# Patient Record
Sex: Male | Born: 1945 | Race: White | Hispanic: No | Marital: Married | Smoking: Former smoker
Health system: Southern US, Community
[De-identification: ages and names within clinical notes are randomized; demographics above are authoritative.]

## PROBLEM LIST (undated history)

## (undated) DIAGNOSIS — IMO0002 Reserved for concepts with insufficient information to code with codable children: Secondary | ICD-10-CM

## (undated) DIAGNOSIS — K219 Gastro-esophageal reflux disease without esophagitis: Secondary | ICD-10-CM

## (undated) DIAGNOSIS — I2510 Atherosclerotic heart disease of native coronary artery without angina pectoris: Secondary | ICD-10-CM

## (undated) DIAGNOSIS — M1990 Unspecified osteoarthritis, unspecified site: Secondary | ICD-10-CM

## (undated) DIAGNOSIS — R112 Nausea with vomiting, unspecified: Secondary | ICD-10-CM

## (undated) DIAGNOSIS — Z9889 Other specified postprocedural states: Secondary | ICD-10-CM

## (undated) DIAGNOSIS — K649 Unspecified hemorrhoids: Secondary | ICD-10-CM

## (undated) DIAGNOSIS — I10 Essential (primary) hypertension: Secondary | ICD-10-CM

## (undated) HISTORY — PX: APPENDECTOMY: SHX54

## (undated) HISTORY — PX: TONSILLECTOMY: SUR1361

---

## 1985-12-03 HISTORY — PX: BACK SURGERY: SHX140

## 1987-12-04 HISTORY — PX: BACK SURGERY: SHX140

## 1991-12-04 HISTORY — PX: BACK SURGERY: SHX140

## 2008-12-03 HISTORY — PX: CARDIAC CATHETERIZATION: SHX172

## 2009-04-18 ENCOUNTER — Encounter: Payer: Self-pay | Admitting: Emergency Medicine

## 2009-04-18 ENCOUNTER — Inpatient Hospital Stay (HOSPITAL_COMMUNITY): Admission: AD | Admit: 2009-04-18 | Discharge: 2009-04-21 | Payer: Self-pay | Admitting: Cardiology

## 2009-04-18 ENCOUNTER — Ambulatory Visit: Payer: Self-pay | Admitting: Cardiology

## 2009-04-20 ENCOUNTER — Encounter: Payer: Self-pay | Admitting: Cardiology

## 2009-05-11 ENCOUNTER — Encounter: Payer: Self-pay | Admitting: Internal Medicine

## 2009-05-11 ENCOUNTER — Ambulatory Visit: Payer: Self-pay

## 2009-05-18 DIAGNOSIS — I2510 Atherosclerotic heart disease of native coronary artery without angina pectoris: Secondary | ICD-10-CM | POA: Insufficient documentation

## 2009-05-18 DIAGNOSIS — M79609 Pain in unspecified limb: Secondary | ICD-10-CM

## 2009-05-18 DIAGNOSIS — I10 Essential (primary) hypertension: Secondary | ICD-10-CM | POA: Insufficient documentation

## 2009-05-18 DIAGNOSIS — M549 Dorsalgia, unspecified: Secondary | ICD-10-CM | POA: Insufficient documentation

## 2009-05-18 DIAGNOSIS — I498 Other specified cardiac arrhythmias: Secondary | ICD-10-CM

## 2009-05-18 DIAGNOSIS — J301 Allergic rhinitis due to pollen: Secondary | ICD-10-CM | POA: Insufficient documentation

## 2009-05-18 DIAGNOSIS — R002 Palpitations: Secondary | ICD-10-CM | POA: Insufficient documentation

## 2009-05-19 ENCOUNTER — Ambulatory Visit: Payer: Self-pay | Admitting: Internal Medicine

## 2011-03-13 LAB — BASIC METABOLIC PANEL
BUN: 11 mg/dL (ref 6–23)
CO2: 27 mEq/L (ref 19–32)
CO2: 28 mEq/L (ref 19–32)
Calcium: 8.8 mg/dL (ref 8.4–10.5)
Chloride: 108 mEq/L (ref 96–112)
Chloride: 112 mEq/L (ref 96–112)
Creatinine, Ser: 0.87 mg/dL (ref 0.4–1.5)
GFR calc Af Amer: >60 mL/min (ref >60)
GFR calc non Af Amer: >60 mL/min (ref >60)
Glucose, Bld: 108 mg/dL — ABNORMAL HIGH (ref 70–99)
Glucose, Bld: 176 mg/dL — ABNORMAL HIGH (ref 70–99)
Glucose, Bld: 95 mg/dL (ref 70–99)
Potassium: 4.3 mEq/L (ref 3.5–5.1)
Sodium: 142 mEq/L (ref 135–145)
Sodium: 143 mEq/L (ref 135–145)

## 2011-03-13 LAB — POCT I-STAT 3, ART BLOOD GAS (G3+)
Acid-base deficit: 1 mmol/L (ref 0.0–2.0)
Bicarbonate: 22.2 mEq/L (ref 20.0–24.0)
pH, Arterial: 7.436 (ref 7.350–7.450)

## 2011-03-13 LAB — CBC
HCT: 38.8 % — ABNORMAL LOW (ref 39.0–52.0)
HCT: 41.6 % (ref 39.0–52.0)
HCT: 43 % (ref 39.0–52.0)
HCT: 43.1 % (ref 39.0–52.0)
Hemoglobin: 13.5 g/dL (ref 13.0–17.0)
Hemoglobin: 14.7 g/dL (ref 13.0–17.0)
Hemoglobin: 14.9 g/dL (ref 13.0–17.0)
MCHC: 34.8 g/dL (ref 30.0–36.0)
MCV: 94.9 fL (ref 78.0–100.0)
Platelets: 234 10*3/uL (ref 150–400)
Platelets: 249 10*3/uL (ref 150–400)
RDW: 13.8 % (ref 11.5–15.5)
RDW: 13.8 % (ref 11.5–15.5)
RDW: 13.9 % (ref 11.5–15.5)
WBC: 7.3 10*3/uL (ref 4.0–10.5)
WBC: 7.8 10*3/uL (ref 4.0–10.5)

## 2011-03-13 LAB — DIFFERENTIAL
Basophils Absolute: 0 10*3/uL (ref 0.0–0.1)
Basophils Absolute: 0 10*3/uL (ref 0.0–0.1)
Basophils Relative: 0 % (ref 0–1)
Eosinophils Absolute: 0.1 10*3/uL (ref 0.0–0.7)
Eosinophils Relative: 1 % (ref 0–5)
Lymphocytes Relative: 28 % (ref 12–46)
Lymphs Abs: 2.3 10*3/uL (ref 0.7–4.0)
Monocytes Absolute: 0.6 10*3/uL (ref 0.1–1.0)
Monocytes Absolute: 0.6 10*3/uL (ref 0.1–1.0)
Neutro Abs: 4.9 10*3/uL (ref 1.7–7.7)
Neutrophils Relative %: 63 % (ref 43–77)

## 2011-03-13 LAB — BRAIN NATRIURETIC PEPTIDE: Pro B Natriuretic peptide (BNP): <30 pg/mL (ref 0.0–100.0)

## 2011-03-13 LAB — MAGNESIUM: Magnesium: 2.5 mg/dL (ref 1.5–2.5)

## 2011-03-13 LAB — COMPREHENSIVE METABOLIC PANEL
Alkaline Phosphatase: 67 U/L (ref 39–117)
BUN: 17 mg/dL (ref 6–23)
Chloride: 107 mEq/L (ref 96–112)
Glucose, Bld: 103 mg/dL — ABNORMAL HIGH (ref 70–99)
Potassium: 4.1 mEq/L (ref 3.5–5.1)
Total Bilirubin: 0.7 mg/dL (ref 0.3–1.2)

## 2011-03-13 LAB — LIPID PANEL
HDL: 44 mg/dL (ref >39)
Triglycerides: 97 mg/dL (ref <150)
VLDL: 19 mg/dL (ref 0–40)

## 2011-03-13 LAB — POCT CARDIAC MARKERS: Myoglobin, poc: 119 ng/mL (ref 12–200)

## 2011-03-13 LAB — D-DIMER, QUANTITATIVE: D-Dimer, Quant: 0.24 ug/mL-FEU (ref 0.00–0.48)

## 2011-03-13 LAB — CARDIAC PANEL(CRET KIN+CKTOT+MB+TROPI)
CK, MB: 10.4 ng/mL — ABNORMAL HIGH (ref 0.3–4.0)
CK, MB: 6.1 ng/mL — ABNORMAL HIGH (ref 0.3–4.0)
CK, MB: 6.5 ng/mL — ABNORMAL HIGH (ref 0.3–4.0)
Relative Index: 3.1 — ABNORMAL HIGH (ref 0.0–2.5)
Relative Index: 3.8 — ABNORMAL HIGH (ref 0.0–2.5)
Total CK: 207 U/L (ref 7–232)
Total CK: 242 U/L — ABNORMAL HIGH (ref 7–232)
Troponin I: 0.02 ng/mL (ref 0.00–0.06)

## 2011-03-13 LAB — PROTIME-INR
INR: 0.9 (ref 0.00–1.49)
INR: 1 (ref 0.00–1.49)
Prothrombin Time: 13 seconds (ref 11.6–15.2)

## 2011-03-13 LAB — APTT: aPTT: 26 seconds (ref 24–37)

## 2011-03-13 LAB — POCT I-STAT, CHEM 8
BUN: 20 mg/dL (ref 6–23)
Calcium, Ion: 1.18 mmol/L (ref 1.12–1.32)
Glucose, Bld: 94 mg/dL (ref 70–99)
Sodium: 139 mEq/L (ref 135–145)
TCO2: 25 mmol/L (ref 0–100)

## 2011-03-13 LAB — TSH: TSH: 0.689 u[IU]/mL (ref 0.350–4.500)

## 2011-03-13 LAB — HEPARIN LEVEL (UNFRACTIONATED): Heparin Unfractionated: 0.56 IU/mL (ref 0.30–0.70)
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
## 2011-04-17 NOTE — Cardiovascular Report (Signed)
Joel Mcneil, Joel Mcneil NO.:  [id number]   MEDICAL RECORD NO.:  [id number]           PATIENT TYPE:   LOCATION:                                 FACILITY:   PHYSICIAN:  Arturo Morton. Riley Kill, MD, FACCDATE OF BIRTH:  10-10-1946   DATE OF PROCEDURE:  04/19/2009  DATE OF DISCHARGE:                            CARDIAC CATHETERIZATION   INDICATIONS:  Joel Mcneil is a 65 year old gentleman whose wife is a  Engineer, civil (consulting).  He developed some heaviness in the chest.  He was brought to  [facility] Emergency Room and subsequently transferred to the [facility] Emergency Room.  Importantly, the patient did have relief with  nitroglycerin.  CPK-MB was positive, but troponins have been normal.  He  was seen in consultation and subsequently referred for diagnostic  cardiac catheterization.  Risks, benefits, and alternatives were  discussed with the patient, and he consented to proceed.   PROCEDURE:  1. Left heart catheterization  2. Selective coronary arteriography.  3. Selective left ventriculography.   DESCRIPTION OF THE PROCEDURE:  The patient was brought to the  catheterization laboratory and prepped and draped in the usual fashion.  Through an anterior puncture, the right femoral artery was easily  entered.  A 5-French sheath was subsequently placed without difficulty.  Following this, left ventricular pressures were recorded.  We did do an  arterial blood gas as well.  Coronary arteriography was then performed  using a left Judkins catheter, and a Williams right catheter.  Following  this, central aortic and left ventricular pressures were measured with  the pigtail, and ventriculography was done in the RAO projection.  There  were no major complications.  He was taken to the holding area in  satisfactory clinical condition.   HEMODYNAMIC DATA:  1. The initial central aortic pressure was 102/65, mean 82.  2. Left ventricular pressure was 102/8.  3. There were no gradient or  pullback across the aortic valve.  4. Arterial blood gas pH 7.42, pO2 78, pCO2 33.   ANGIOGRAPHIC DATA:  1. On plain fluoroscopy, there is mild calcification of the mid left      anterior descending artery.  2. Ventriculography in the RAO projection reveals preserved global      systolic function.  Ejection fraction may be at the low limits of      normal, perhaps 50-55% by estimate.  There does not appear to be      significant mitral regurgitation noted.  3. The left main is free of critical disease.  4. The LAD courses to the apex.  There is one major diagonal branch.      There is calcification that appears just beyond the diagonal branch      with about 30-40% plaquing in this area.  It does not appear to be      high grade.  Likewise, the diagonal branch probably has some ostial      narrowing of about 30%.  No critical stenosis is noted.  5. The circumflex is a very large-caliber vessel.  There is a tiny  first marginal branch with 50% ostial narrowing and second marginal      with perhaps 30-40% proximal narrowing.  The distal circumflex      bifurcates into a large posterolateral segment, and marginal is      free of critical disease.  6. The right coronary artery is a modest-size vessel.  There is      perhaps minimal luminal irregularity but no significant focal      stenosis.   CONCLUSIONS:  1. Preserved and low normal overall ejection fraction.  2. Modest calcification of the mid left anterior descending artery      with mild plaquing.  3. Minor scattered irregularities as noted above.   DISPOSITION:  The patient did have improvement with IV nitroglycerin, he  also has a positive CPK-MB, although the troponin is normal.  We will  continue to monitor him.  Of note, his chest x-ray reveals normal heart  size and contour without interstitial edema.  His LVEDP is normal.  His  D-dimer has been checked and is unremarkable.  A 2-D echo will be  obtained.       Arturo Morton. Riley Kill, MD, [facility]  Electronically Signed     TDS/MEDQ  D:  04/19/2009  T:  04/20/2009  Job:  [id number]   cc:   Arturo Morton. Riley Kill, MD, [facility]  [name], MD  CV Laboratory.

## 2011-04-17 NOTE — Discharge Summary (Signed)
Joel Mcneil, Joel Mcneil NO.:  [id number]   MEDICAL RECORD NO.:  [id number]          PATIENT TYPE:  INP   LOCATION:  3711                         FACILITY:  [facility]   PHYSICIAN:  Doylene Canning. Ladona Ridgel, MD    DATE OF BIRTH:  28-Feb-1946   DATE OF ADMISSION:  04/18/2009  DATE OF DISCHARGE:  04/20/2009                               DISCHARGE SUMMARY   The patient has allergy to PENICILLIN, which causes hives.   Time for this dictation, examination, and explanation to the patient  greater than 45 minutes.   FINAL DIAGNOSES:  1. Discharging day 1, status post left heart catheterization, study      shows nonobstructive coronary artery disease, ejection fraction of      50-55%.  2. The patient admitted with chest pain over a 3- to 4-days period      prior to admission and described as heaviness, it would wax and      wane.      a.     Also, palpitation.  3. Bigeminy on cardiac monitor.  4. Troponin I studies x4 all negative.  5. Electrocardiogram nondiagnostic for acute coronary syndrome.   SECONDARY DIAGNOSES:  1. Chronic back pain.      a.     Four back surgeries with fusion.  2. Seasonal allergies.  3. Hypertension with Norvasc recently advanced from 5 mg to 10 mg.  4. The patient has chronic back pain.   PROCEDURE:  Apr 19, 2009, left heart catheterization study showed that  there was nonobstructive coronary artery disease.  The left anterior  descending had a 40% stenosis after a diagonal.  The left circumflex was  free of significant disease; however, the first obtuse marginal with 50%  ostial stenosis, the second obtuse marginal with 30-40% ostial, the  right coronary artery system was free of significant disease, and the  ejection fraction at low normal 50-55%.  The patient will have a 2-D  echocardiogram in 3-4 weeks to assess ejection fraction and right heart  pressures.   BRIEF HISTORY:  Joel Mcneil is a 65 year old male.  He has a history of  hypertension.   He presents with 4 to 5 days' worth of chest pressure.  He noticed this at rest, it is a heavy sensation across the chest  anteriorly, and it is associated with palpitation.  He rates it as 6/10  in intensity.  It is associated with shortness of breath.  He does not  have diaphoresis or nausea, and there is no radiation.   The patient has been recently working hard in the backyard tilling.  He  notes profound weakness for the last 2-3 weeks.  This has been  progressive.  He usually walks on a treadmill, but has not used a  treadmill due to this weakness for the last 2-3 weeks.   The patient is also noted by Joel Mcneil that he is not even getting around  the house much, sitting in the recliner mostly.  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  She is concerned about this and spoke with him today about it and  he  said he was scared.  He thinks he may be having a heart attack.  The  patient then drove himself with Joel Mcneil to [facility] Emergency Room,  he was found to be relatively hypotensive, blood pressure in the low  100s.  He was given IV fluids and he has not received any beta-blockade  or nitrites at [facility].  He was transferred to [facility]  having been given aspirin 81 mg x4.   The patient notes chest pressure 1-3 times every day, lasting 3-4 hours  at a time.  On transport to [facility], he noted Joel chest pressure  4/10.  He has been given Lopressor 5 mg IV.   The patient has a history of hypertension and now having chest pressure  concerning for acute coronary syndrome.  He has decreased exercise  tolerance lately.  He will be admitted to rule out myocardial infarction  with serial cardiac enzymes.  He will be placed on heparin drip and  continued on beta-blockers.  Joel Norvasc will be helped.  Joel lipid  panel will be also checked.  He will be held n.p.o. for possible  evaluation by stress or catheterization the next morning.   HOSPITAL COURSE:  The patient was admitted on May 17  and transferred  from [facility] with waxing and waning chest heaviness.  Joel  troponin enzymes were negative x4.  Electrocardiogram showed no ST-T  changes.  He arrived on IV heparin.  He was seen in consultation on May  18 by Dr. Ladona Ridgel.  Dr. Ladona Ridgel thought Joel chest pain, which had actually  kicked up again at the time of examination somewhat concerning and  scheduled him for left and right heart catheterizations.  A left heart  catheterization was obtained.  It showed nonobstructive coronary artery  disease as described above with ejection fraction 50-55%.  The patient  on postprocedure day #1 is relating that Joel chest pain has resolved.  He had a long conversation with Dr. Ladona Ridgel regarding activity and what  to expect going forward.  Joel medications have been somewhat changed.   MEDICATIONS:  1. He continues on enteric-coated aspirin 81 mg daily.  2. New medication, metoprolol succinate 25 mg daily.  3. New medication, simvastatin 20 mg daily at bedtime for cholesterol      control.  4. Diclofenac 75 mg twice daily.  5. Oxycodone 10/325 four times a day as needed.  6. Fish oil capsule take as before this admission.  7. Advil as needed.  8. He is asked to stop taking Norvasc.   He follows up at [facility] [address], [place].  1. Echocardiogram on Wednesday, June 9 at 10:30.  2. To see Dr. Ladona Ridgel on Thursday, June 7 at 4 o'clock.   At this point of the dictation, Dr. Riley Kill stepped by, and after having  talk with Dr. Ladona Ridgel, he thought that the patient should stay another  day.  Joel CK-MBs were elevated, but are declining on a regular basis.  He thought that the patient needs further observation.  We will amend  the followup dictation starting May 20.  This is what we know as at the  current time.   I will give the laboratory studies as of this date May 19.  Joel  hemoglobin May 19 was 13.5, hematocrit 38.8, platelets of 218, and white  cells  are 7.1.  Sodium 143, potassium 4.3, chloride 112, carbonate 27,  BUN is  15, creatinine is 1, and glucose is 95.  The BNP this admission  was less than 30.  Total cholesterol 216, triglycerides 97,  HDL cholesterol 44, and LDL cholesterol 153.  Joel TSH this admission was  0.689.  The patient's troponin I studies were as follows:  0.82, then  0.02, then 0.01, then 0.01.  The CK-MBs on May 17 at 10.4, CK-MB May 18  at 9.3, CK-MB on May 18 at 5.7, and CK-MB on May 19 is 6.1.      Maple Mirza, PA      Doylene Canning. Ladona Ridgel, MD  Electronically Signed    GM/MEDQ  D:  04/20/2009  T:  04/20/2009  Job:  [id number]   cc:   [name], MD

## 2013-05-06 ENCOUNTER — Other Ambulatory Visit: Payer: Self-pay | Admitting: Neurosurgery

## 2013-05-06 DIAGNOSIS — M48061 Spinal stenosis, lumbar region without neurogenic claudication: Secondary | ICD-10-CM

## 2013-05-14 ENCOUNTER — Ambulatory Visit
Admission: RE | Admit: 2013-05-14 | Discharge: 2013-05-14 | Disposition: A | Discharge status: Home / Self Care | Payer: Medicare Other | Source: Ambulatory Visit | Attending: Neurosurgery | Admitting: Neurosurgery

## 2013-05-14 DIAGNOSIS — M48061 Spinal stenosis, lumbar region without neurogenic claudication: Secondary | ICD-10-CM

## 2013-05-14 MED ORDER — GADOBENATE DIMEGLUMINE 529 MG/ML IV SOLN
18.0000 mL | Freq: Once | INTRAVENOUS | Status: AC | PRN
  Administered 2013-05-14: 18 mL via INTRAVENOUS

## 2013-06-08 ENCOUNTER — Other Ambulatory Visit: Payer: Self-pay | Admitting: Neurosurgery

## 2013-06-08 ENCOUNTER — Encounter (HOSPITAL_COMMUNITY): Payer: Self-pay | Admitting: Pharmacy Technician

## 2013-06-16 ENCOUNTER — Encounter (HOSPITAL_COMMUNITY)
Admission: RE | Admit: 2013-06-16 | Discharge: 2013-06-16 | Disposition: A | Discharge status: Home / Self Care | Payer: Medicare Other | Source: Ambulatory Visit | Attending: Neurosurgery | Admitting: Neurosurgery

## 2013-06-16 ENCOUNTER — Encounter (HOSPITAL_COMMUNITY): Payer: Self-pay

## 2013-06-16 ENCOUNTER — Other Ambulatory Visit: Payer: Self-pay | Admitting: Neurosurgery

## 2013-06-16 HISTORY — DX: Other specified postprocedural states: Z98.890

## 2013-06-16 HISTORY — DX: Atherosclerotic heart disease of native coronary artery without angina pectoris: I25.10

## 2013-06-16 HISTORY — DX: Unspecified osteoarthritis, unspecified site: M19.90

## 2013-06-16 HISTORY — DX: Essential (primary) hypertension: I10

## 2013-06-16 HISTORY — DX: Nausea with vomiting, unspecified: R11.2

## 2013-06-16 HISTORY — DX: Unspecified hemorrhoids: K64.9

## 2013-06-16 HISTORY — DX: Reserved for concepts with insufficient information to code with codable children: IMO0002

## 2013-06-16 HISTORY — DX: Gastro-esophageal reflux disease without esophagitis: K21.9

## 2013-06-16 LAB — URINALYSIS, ROUTINE W REFLEX MICROSCOPIC
Leukocytes, UA: NEGATIVE
Nitrite: NEGATIVE
Specific Gravity, Urine: 1.029 (ref 1.005–1.030)
pH: 5.5 (ref 5.0–8.0)

## 2013-06-16 LAB — BASIC METABOLIC PANEL
Calcium: 9.4 mg/dL (ref 8.4–10.5)
Chloride: 105 mEq/L (ref 96–112)
Creatinine, Ser: 1.02 mg/dL (ref 0.50–1.35)
GFR calc Af Amer: 85 mL/min — ABNORMAL LOW (ref >90)
Sodium: 142 mEq/L (ref 135–145)

## 2013-06-16 LAB — CBC WITH DIFFERENTIAL/PLATELET
Basophils Absolute: 0 10*3/uL (ref 0.0–0.1)
HCT: 43.6 % (ref 39.0–52.0)
Lymphocytes Relative: 32 % (ref 12–46)
Lymphs Abs: 2.1 10*3/uL (ref 0.7–4.0)
Neutro Abs: 3.8 10*3/uL (ref 1.7–7.7)
Platelets: 189 10*3/uL (ref 150–400)
RBC: 4.58 MIL/uL (ref 4.22–5.81)
RDW: 14.8 % (ref 11.5–15.5)
WBC: 6.5 10*3/uL (ref 4.0–10.5)

## 2013-06-16 LAB — TYPE AND SCREEN: Antibody Screen: NEGATIVE

## 2013-06-16 LAB — APTT: aPTT: 26 seconds (ref 24–37)

## 2013-06-16 LAB — SURGICAL PCR SCREEN: Staphylococcus aureus: POSITIVE — AB

## 2013-06-16 LAB — PROTIME-INR
INR: 0.91 (ref 0.00–1.49)
Prothrombin Time: 12.1 seconds (ref 11.6–15.2)

## 2013-06-16 NOTE — Pre-Procedure Instructions (Signed)
Joel Mcneil  06/16/2013   Your procedure is scheduled on:  Thursday July 17,2014  Report to [facility] at 0530 AM.  Call this number if you have problems the morning of surgery: [phone]   Remember:   Do not eat food or drink liquids after midnight.   Take these medicines the morning of surgery with A SIP OF WATER: Gabapentin, Morphine(MSIR), and Omeprazole   Do not wear jewelry.  Do not wear lotions, powders, or perfumes. You may wear deodorant.   Men may shave face and neck.  Do not bring valuables to the hospital.  [facility] is not responsible  for any belongings or valuables.  Contacts, dentures or bridgework may not be worn into surgery.  Leave suitcase in the car. After surgery it may be brought to your room.  For patients admitted to the hospital, checkout time is 11:00 AM the day of  discharge.   Patients discharged the day of surgery will not be allowed to drive  home.    Special Instructions: Shower using CHG 2 nights before surgery and the night before surgery.  If you shower the day of surgery use CHG.  Use special wash - you have one bottle of CHG for all showers.  You should use approximately 1/3 of the bottle for each shower.   Please read over the following fact sheets that you were given: Pain Booklet, Coughing and Deep Breathing, Blood Transfusion Information, MRSA Information and Surgical Site Infection Prevention

## 2013-06-16 NOTE — Progress Notes (Signed)
Anesthesia Chart Review:  Patient is a 67 year old male scheduled for L2-3, L3-4 PLIF, right L1-2 laminectomy on 06/18/13 by Dr. Phoebe Perch.  History includes post-operative N/V, former smoker, HTN, GERD, arthritis, tonsillectomy, appendectomy, prior back surgery.  He had non-obstructive CAD by 2010 cath.  He has not seen cardiology (Dr. Lewayne Bunting) since 05/2009.  PCP is listed as Dr. [name] [phone]).  He remains on b-blocker therapy.  EKG on 06/16/13 showed SB at 54 bpm.  No CV symptoms were documented at his PAT visit.  Cardiac cath on 04/19/09 (done for chest pain with elevated CK-MB but negative troponin) showed mild calcification of the mid LAD, EF estimated at 50-55%, no significant MR.  Left main is free of critical disease.  The LAD courses to the apex. There is one major diagonal branch. There is calcification that appears just beyond the diagonal branch with about 30-40% plaquing in this area. The diagonal branch has ~ 30% ostial stenosis.  Circumflex is a very large-caliber vessell.  There is a tiny OM1 With 50% ostial narrowing, OM2 with 30-40% proximal narrowing. The distal CX bifurcates into a large PL segment and marginal is free of critical disease.  RCA is moderate size with minimal luminal irregularities.    Echo on 05/11/09 showed: 1. Left ventricle: The cavity size was normal. There was mild focal basal hypertrophy of the septum. Systolic function was normal. The estimated ejection fraction was in the range of 60% to 65%. Wall motion was normal; there were no regional wall motion abnormalities. Doppler parameters are consistent with abnormal left ventricular relaxation (grade 1 diastolic dysfunction). Doppler parameters are consistent with high ventricular filling pressure. 2. Left atrium: The atrium was mildly dilated.  CXR on 06/16/13 showed: Minimal bronchitic changes and hyperaeration. Questionable nodular density versus clothing artifact at right apex; recommend repeat PA chest  radiograph with shirt/clothing removed for better assessment.  (This has already been reviewed by Dr. Phoebe Perch who has ordered a repeat PA CXR on the morning of surgery without upper body clothing.)  Preoperative labs noted.    Patient had non-obstructive CAD by cath ~ 4 years ago.  He is not currently followed by cardiology.  EKG did not show any ischemic changes.  He will be evaluated by his assigned anesthesiologist on the day of surgery.  If he remains asymptomatic from a CV standpoint then I would anticipate that he could proceed as planned.  Anesthesiologist Dr. Chaney Malling agrees with this plan.  Velna Ochs [facility]/Anesthesiology Phone [phone] 06/16/2013 5:06 PM

## 2013-06-17 MED ORDER — VANCOMYCIN HCL IN DEXTROSE 1-5 GM/200ML-% IV SOLN
1000.0000 mg | INTRAVENOUS | Status: AC
  Administered 2013-06-18: 1000 mg via INTRAVENOUS
  Filled 2013-06-17 (×2): qty 200

## 2013-06-18 ENCOUNTER — Inpatient Hospital Stay (HOSPITAL_COMMUNITY): Payer: Medicare Other

## 2013-06-18 ENCOUNTER — Inpatient Hospital Stay (HOSPITAL_COMMUNITY)
Admission: RE | Admit: 2013-06-18 | Discharge: 2013-06-22 | DRG: 458 | Disposition: A | Discharge status: Home Health | Payer: Medicare Other | Source: Ambulatory Visit | Attending: Neurosurgery | Admitting: Neurosurgery

## 2013-06-18 ENCOUNTER — Encounter (HOSPITAL_COMMUNITY): Payer: Self-pay | Admitting: *Deleted

## 2013-06-18 ENCOUNTER — Inpatient Hospital Stay (HOSPITAL_COMMUNITY): Payer: Medicare Other | Admitting: Anesthesiology

## 2013-06-18 ENCOUNTER — Encounter (HOSPITAL_COMMUNITY)
Admission: RE | Disposition: A | Discharge status: Home Health | Payer: Self-pay | Source: Ambulatory Visit | Attending: Neurosurgery

## 2013-06-18 ENCOUNTER — Encounter (HOSPITAL_COMMUNITY): Payer: Self-pay | Admitting: Vascular Surgery

## 2013-06-18 DIAGNOSIS — Z7982 Long term (current) use of aspirin: Secondary | ICD-10-CM

## 2013-06-18 DIAGNOSIS — Z01812 Encounter for preprocedural laboratory examination: Secondary | ICD-10-CM

## 2013-06-18 DIAGNOSIS — M47817 Spondylosis without myelopathy or radiculopathy, lumbosacral region: Secondary | ICD-10-CM | POA: Diagnosis present

## 2013-06-18 DIAGNOSIS — Z79899 Other long term (current) drug therapy: Secondary | ICD-10-CM

## 2013-06-18 DIAGNOSIS — M4120 Other idiopathic scoliosis, site unspecified: Principal | ICD-10-CM | POA: Diagnosis present

## 2013-06-18 SURGERY — POSTERIOR LUMBAR FUSION 2 LEVEL
Anesthesia: General | Site: Back | Wound class: Clean

## 2013-06-18 MED ORDER — ONDANSETRON HCL 4 MG/2ML IJ SOLN: 4.0000 mg | Freq: Once | INTRAMUSCULAR | Status: DC | PRN

## 2013-06-18 MED ORDER — LIDOCAINE HCL (CARDIAC) 20 MG/ML IV SOLN
INTRAVENOUS | Status: DC | PRN
  Administered 2013-06-18: 40 mg via INTRAVENOUS

## 2013-06-18 MED ORDER — SODIUM CHLORIDE 0.9 % IJ SOLN
3.0000 mL | INTRAMUSCULAR | Status: DC | PRN
  Administered 2013-06-19: 3 mL via INTRAVENOUS

## 2013-06-18 MED ORDER — KETOROLAC TROMETHAMINE 30 MG/ML IJ SOLN
30.0000 mg | Freq: Four times a day (QID) | INTRAMUSCULAR | Status: AC
  Administered 2013-06-18 – 2013-06-19 (×3): 30 mg via INTRAVENOUS
  Filled 2013-06-18 (×3): qty 1

## 2013-06-18 MED ORDER — MORPHINE SULFATE 15 MG PO TABS
15.0000 mg | ORAL_TABLET | Freq: Three times a day (TID) | ORAL | Status: DC
  Administered 2013-06-18 – 2013-06-22 (×11): 15 mg via ORAL
  Filled 2013-06-18 (×11): qty 1

## 2013-06-18 MED ORDER — HYDROMORPHONE HCL PF 1 MG/ML IJ SOLN
INTRAMUSCULAR | Status: DC | PRN
  Administered 2013-06-18 (×2): 0.5 mg via INTRAVENOUS

## 2013-06-18 MED ORDER — FENTANYL CITRATE 0.05 MG/ML IJ SOLN
INTRAMUSCULAR | Status: DC | PRN
  Administered 2013-06-18: 50 ug via INTRAVENOUS
  Administered 2013-06-18: 100 ug via INTRAVENOUS
  Administered 2013-06-18 (×2): 50 ug via INTRAVENOUS
  Administered 2013-06-18: 100 ug via INTRAVENOUS

## 2013-06-18 MED ORDER — MIDAZOLAM HCL 5 MG/5ML IJ SOLN
INTRAMUSCULAR | Status: DC | PRN
  Administered 2013-06-18: 2 mg via INTRAVENOUS

## 2013-06-18 MED ORDER — OXYCODONE HCL 5 MG PO TABS
15.0000 mg | ORAL_TABLET | Freq: Four times a day (QID) | ORAL | Status: DC | PRN
  Administered 2013-06-18 – 2013-06-22 (×10): 15 mg via ORAL
  Filled 2013-06-18 (×10): qty 3

## 2013-06-18 MED ORDER — SODIUM CHLORIDE 0.9 % IR SOLN
Status: DC | PRN
  Administered 2013-06-18: 08:00:00

## 2013-06-18 MED ORDER — PANTOPRAZOLE SODIUM 40 MG PO TBEC
40.0000 mg | DELAYED_RELEASE_TABLET | Freq: Every day | ORAL | Status: DC
  Administered 2013-06-19 – 2013-06-22 (×4): 40 mg via ORAL
  Filled 2013-06-18 (×5): qty 1

## 2013-06-18 MED ORDER — WHITE PETROLATUM GEL
Status: AC
  Administered 2013-06-18: 16:00:00
  Filled 2013-06-18: qty 5

## 2013-06-18 MED ORDER — ACETAMINOPHEN 650 MG RE SUPP: 650.0000 mg | RECTAL | Status: DC | PRN

## 2013-06-18 MED ORDER — GABAPENTIN 600 MG PO TABS
600.0000 mg | ORAL_TABLET | Freq: Three times a day (TID) | ORAL | Status: DC
  Administered 2013-06-18 – 2013-06-22 (×12): 600 mg via ORAL
  Filled 2013-06-18 (×16): qty 1

## 2013-06-18 MED ORDER — MAGNESIUM HYDROXIDE 400 MG/5ML PO SUSP: 30.0000 mL | Freq: Every day | ORAL | Status: DC | PRN

## 2013-06-18 MED ORDER — BACITRACIN 50000 UNITS IM SOLR
INTRAMUSCULAR | Status: AC
  Filled 2013-06-18: qty 1

## 2013-06-18 MED ORDER — HYDROMORPHONE HCL PF 1 MG/ML IJ SOLN: 0.2500 mg | INTRAMUSCULAR | Status: DC | PRN

## 2013-06-18 MED ORDER — DEXAMETHASONE SODIUM PHOSPHATE 4 MG/ML IJ SOLN
INTRAMUSCULAR | Status: DC | PRN
  Administered 2013-06-18: 8 mg via INTRAVENOUS

## 2013-06-18 MED ORDER — SODIUM CHLORIDE 0.9 % IJ SOLN: 9.0000 mL | INTRAMUSCULAR | Status: DC | PRN

## 2013-06-18 MED ORDER — GLYCOPYRROLATE 0.2 MG/ML IJ SOLN
INTRAMUSCULAR | Status: DC | PRN
  Administered 2013-06-18: .6 mg via INTRAVENOUS
  Administered 2013-06-18: 0.2 mg via INTRAVENOUS

## 2013-06-18 MED ORDER — PROPOFOL 10 MG/ML IV BOLUS
INTRAVENOUS | Status: DC | PRN
  Administered 2013-06-18: 150 mg via INTRAVENOUS

## 2013-06-18 MED ORDER — METHOCARBAMOL 100 MG/ML IJ SOLN
500.0000 mg | Freq: Four times a day (QID) | INTRAVENOUS | Status: DC | PRN
  Filled 2013-06-18: qty 5

## 2013-06-18 MED ORDER — METOPROLOL SUCCINATE ER 50 MG PO TB24
50.0000 mg | ORAL_TABLET | Freq: Every day | ORAL | Status: DC
  Administered 2013-06-18 – 2013-06-22 (×5): 50 mg via ORAL
  Filled 2013-06-18 (×5): qty 1

## 2013-06-18 MED ORDER — ONDANSETRON HCL 4 MG/2ML IJ SOLN: 4.0000 mg | INTRAMUSCULAR | Status: DC | PRN

## 2013-06-18 MED ORDER — NALOXONE HCL 0.4 MG/ML IJ SOLN: 0.4000 mg | INTRAMUSCULAR | Status: DC | PRN

## 2013-06-18 MED ORDER — ONDANSETRON HCL 4 MG/2ML IJ SOLN
INTRAMUSCULAR | Status: DC | PRN
  Administered 2013-06-18: 4 mg via INTRAVENOUS

## 2013-06-18 MED ORDER — TAMSULOSIN HCL 0.4 MG PO CAPS
0.4000 mg | ORAL_CAPSULE | Freq: Every day | ORAL | Status: DC
  Administered 2013-06-18 – 2013-06-21 (×4): 0.4 mg via ORAL
  Filled 2013-06-18 (×6): qty 1

## 2013-06-18 MED ORDER — MORPHINE SULFATE (PF) 1 MG/ML IV SOLN
INTRAVENOUS | Status: DC
  Administered 2013-06-18: 1.5 mg via INTRAVENOUS
  Administered 2013-06-18: 13:00:00 via INTRAVENOUS
  Administered 2013-06-19: 3 mg via INTRAVENOUS
  Administered 2013-06-19: 1.5 mg via INTRAVENOUS

## 2013-06-18 MED ORDER — SODIUM CHLORIDE 0.9 % IJ SOLN
3.0000 mL | Freq: Two times a day (BID) | INTRAMUSCULAR | Status: DC
  Administered 2013-06-19: 3 mL via INTRAVENOUS
  Administered 2013-06-20: 21:00:00 via INTRAVENOUS
  Administered 2013-06-20 – 2013-06-21 (×3): 3 mL via INTRAVENOUS

## 2013-06-18 MED ORDER — ROCURONIUM BROMIDE 100 MG/10ML IV SOLN
INTRAVENOUS | Status: DC | PRN
  Administered 2013-06-18: 40 mg via INTRAVENOUS
  Administered 2013-06-18 (×3): 10 mg via INTRAVENOUS

## 2013-06-18 MED ORDER — MORPHINE SULFATE (PF) 1 MG/ML IV SOLN
INTRAVENOUS | Status: AC
  Filled 2013-06-18: qty 25

## 2013-06-18 MED ORDER — VITAMIN D3 25 MCG (1000 UNIT) PO TABS
5000.0000 [IU] | ORAL_TABLET | Freq: Every day | ORAL | Status: DC
  Filled 2013-06-18 (×5): qty 5

## 2013-06-18 MED ORDER — ZOLPIDEM TARTRATE 5 MG PO TABS: 5.0000 mg | ORAL_TABLET | Freq: Every evening | ORAL | Status: DC | PRN

## 2013-06-18 MED ORDER — ACETAMINOPHEN 325 MG PO TABS: 650.0000 mg | ORAL_TABLET | ORAL | Status: DC | PRN

## 2013-06-18 MED ORDER — SODIUM CHLORIDE 0.9 % IV SOLN: 250.0000 mL | INTRAVENOUS | Status: DC

## 2013-06-18 MED ORDER — SODIUM CHLORIDE 0.9 % IV SOLN
INTRAVENOUS | Status: DC | PRN
  Administered 2013-06-18: 11:00:00 via INTRAVENOUS

## 2013-06-18 MED ORDER — LACTATED RINGERS IV SOLN
INTRAVENOUS | Status: DC | PRN
  Administered 2013-06-18 (×3): via INTRAVENOUS

## 2013-06-18 MED ORDER — DIPHENHYDRAMINE HCL 50 MG/ML IJ SOLN: 12.5000 mg | Freq: Four times a day (QID) | INTRAMUSCULAR | Status: DC | PRN

## 2013-06-18 MED ORDER — VITAMIN D-3 125 MCG (5000 UT) PO TABS
5000.0000 [IU] | ORAL_TABLET | Freq: Every day | ORAL | Status: DC
Start: 2013-06-18 — End: 2013-06-18

## 2013-06-18 MED ORDER — VANCOMYCIN HCL IN DEXTROSE 1-5 GM/200ML-% IV SOLN
1000.0000 mg | Freq: Once | INTRAVENOUS | Status: AC
  Administered 2013-06-18: 1000 mg via INTRAVENOUS
  Filled 2013-06-18 (×2): qty 200

## 2013-06-18 MED ORDER — ONDANSETRON HCL 4 MG/2ML IJ SOLN: 4.0000 mg | Freq: Four times a day (QID) | INTRAMUSCULAR | Status: DC | PRN

## 2013-06-18 MED ORDER — BISACODYL 10 MG RE SUPP: 10.0000 mg | Freq: Every day | RECTAL | Status: DC | PRN

## 2013-06-18 MED ORDER — NEOSTIGMINE METHYLSULFATE 1 MG/ML IJ SOLN
INTRAMUSCULAR | Status: DC | PRN
  Administered 2013-06-18: 4 mg via INTRAVENOUS

## 2013-06-18 MED ORDER — LIDOCAINE-EPINEPHRINE 1 %-1:100000 IJ SOLN
INTRAMUSCULAR | Status: DC | PRN
  Administered 2013-06-18: 20 mL

## 2013-06-18 MED ORDER — ACETAMINOPHEN 10 MG/ML IV SOLN
INTRAVENOUS | Status: AC
  Filled 2013-06-18: qty 100

## 2013-06-18 MED ORDER — THROMBIN 20000 UNITS EX SOLR
CUTANEOUS | Status: DC | PRN
  Administered 2013-06-18: 08:00:00 via TOPICAL

## 2013-06-18 MED ORDER — ACETAMINOPHEN 10 MG/ML IV SOLN
1000.0000 mg | Freq: Once | INTRAVENOUS | Status: AC | PRN
  Administered 2013-06-18: 1000 mg via INTRAVENOUS

## 2013-06-18 MED ORDER — SODIUM CHLORIDE 0.9 % IV SOLN
10.0000 mg | INTRAVENOUS | Status: DC | PRN
  Administered 2013-06-18: 5 ug/min via INTRAVENOUS

## 2013-06-18 MED ORDER — DOCUSATE SODIUM 100 MG PO CAPS
100.0000 mg | ORAL_CAPSULE | Freq: Two times a day (BID) | ORAL | Status: DC
  Administered 2013-06-18 – 2013-06-22 (×6): 100 mg via ORAL
  Filled 2013-06-18 (×5): qty 1

## 2013-06-18 MED ORDER — SODIUM CHLORIDE 0.9 % IV SOLN
INTRAVENOUS | Status: AC
  Filled 2013-06-18: qty 500

## 2013-06-18 MED ORDER — KCL IN DEXTROSE-NACL 20-5-0.45 MEQ/L-%-% IV SOLN
INTRAVENOUS | Status: DC
  Administered 2013-06-18 – 2013-06-19 (×2): via INTRAVENOUS
  Filled 2013-06-18 (×8): qty 1000

## 2013-06-18 MED ORDER — DIPHENHYDRAMINE HCL 12.5 MG/5ML PO ELIX: 12.5000 mg | ORAL_SOLUTION | Freq: Four times a day (QID) | ORAL | Status: DC | PRN

## 2013-06-18 MED ORDER — METHOCARBAMOL 500 MG PO TABS
500.0000 mg | ORAL_TABLET | Freq: Four times a day (QID) | ORAL | Status: DC | PRN
  Administered 2013-06-18: 500 mg via ORAL
  Filled 2013-06-18: qty 1

## 2013-06-18 MED ORDER — 0.9 % SODIUM CHLORIDE (POUR BTL) OPTIME
TOPICAL | Status: DC | PRN
  Administered 2013-06-18: 1000 mL

## 2013-06-18 SURGICAL SUPPLY — 73 items
APL SKNCLS STERI-STRIP NONHPOA (GAUZE/BANDAGES/DRESSINGS) ×1
BAG DECANTER FOR FLEXI CONT (MISCELLANEOUS) ×2 IMPLANT
BENZOIN TINCTURE PRP APPL 2/3 (GAUZE/BANDAGES/DRESSINGS) ×1 IMPLANT
BLADE SURG ROTATE 9660 (MISCELLANEOUS) IMPLANT
BUR PRECISION FLUTE 5.0 (BURR) ×2 IMPLANT
CANISTER SUCTION 2500CC (MISCELLANEOUS) ×2 IMPLANT
CLOTH BEACON ORANGE TIMEOUT ST (SAFETY) ×2 IMPLANT
CONT SPEC 4OZ CLIKSEAL STRL BL (MISCELLANEOUS) ×4 IMPLANT
COVER BACK TABLE 24X17X13 BIG (DRAPES) IMPLANT
COVER TABLE BACK 60X90 (DRAPES) ×3 IMPLANT
DRAPE C-ARM 42X72 X-RAY (DRAPES) ×4 IMPLANT
DRAPE LAPAROTOMY 100X72X124 (DRAPES) ×2 IMPLANT
DRAPE MICROSCOPE LEICA (MISCELLANEOUS) ×1 IMPLANT
DRAPE POUCH INSTRU U-SHP 10X18 (DRAPES) ×2 IMPLANT
DRAPE PROXIMA HALF (DRAPES) ×1 IMPLANT
DRAPE SURG 17X23 STRL (DRAPES) ×2 IMPLANT
DRESSING TELFA 8X3 (GAUZE/BANDAGES/DRESSINGS) ×1 IMPLANT
DURAPREP 26ML APPLICATOR (WOUND CARE) ×2 IMPLANT
ELECT REM PT RETURN 9FT ADLT (ELECTROSURGICAL) ×2
ELECTRODE REM PT RTRN 9FT ADLT (ELECTROSURGICAL) ×1 IMPLANT
GAUZE SPONGE 4X4 16PLY XRAY LF (GAUZE/BANDAGES/DRESSINGS) IMPLANT
GLOVE BIO SURGEON STRL SZ8 (GLOVE) ×1 IMPLANT
GLOVE BIOGEL PI IND STRL 8 (GLOVE) IMPLANT
GLOVE BIOGEL PI IND STRL 8.5 (GLOVE) IMPLANT
GLOVE BIOGEL PI INDICATOR 8 (GLOVE) ×1
GLOVE BIOGEL PI INDICATOR 8.5 (GLOVE) ×3
GLOVE ECLIPSE 7.5 STRL STRAW (GLOVE) ×6 IMPLANT
GLOVE EXAM NITRILE LRG STRL (GLOVE) ×1 IMPLANT
GLOVE EXAM NITRILE MD LF STRL (GLOVE) IMPLANT
GLOVE EXAM NITRILE XL STR (GLOVE) IMPLANT
GLOVE EXAM NITRILE XS STR PU (GLOVE) IMPLANT
GLOVE INDICATOR 7.5 STRL GRN (GLOVE) ×1 IMPLANT
GLOVE SURG SS PI 8.0 STRL IVOR (GLOVE) ×4 IMPLANT
GLOVE SURG SS PI 8.5 STRL IVOR (GLOVE) ×4
GLOVE SURG SS PI 8.5 STRL STRW (GLOVE) IMPLANT
GOWN BRE IMP SLV AUR LG STRL (GOWN DISPOSABLE) IMPLANT
GOWN BRE IMP SLV AUR XL STRL (GOWN DISPOSABLE) ×5 IMPLANT
GOWN STRL REIN 2XL LVL4 (GOWN DISPOSABLE) ×1 IMPLANT
KIT BASIN OR (CUSTOM PROCEDURE TRAY) ×2 IMPLANT
KIT ROOM TURNOVER OR (KITS) ×2 IMPLANT
MILL MEDIUM DISP (BLADE) ×2 IMPLANT
NEEDLE HYPO 22GX1.5 SAFETY (NEEDLE) ×3 IMPLANT
NS IRRIG 1000ML POUR BTL (IV SOLUTION) ×2 IMPLANT
PACK LAMINECTOMY NEURO (CUSTOM PROCEDURE TRAY) ×2 IMPLANT
PAD ARMBOARD 7.5X6 YLW CONV (MISCELLANEOUS) ×6 IMPLANT
PATTIES SURGICAL .5 X.5 (GAUZE/BANDAGES/DRESSINGS) IMPLANT
PATTIES SURGICAL .5 X1 (DISPOSABLE) IMPLANT
PATTIES SURGICAL .75X.75 (GAUZE/BANDAGES/DRESSINGS) ×2 IMPLANT
PATTIES SURGICAL 1X1 (DISPOSABLE) IMPLANT
ROD EXPEDIUM PER BENT 65MM (Rod) ×1 IMPLANT
ROD EXPEDIUM PRE BENT 5.5X75 (Rod) ×1 IMPLANT
RUBBERBAND STERILE (MISCELLANEOUS) ×2 IMPLANT
SCREW EXPEDIUM POLYAXIAL 6X45M (Screw) ×1 IMPLANT
SCREW EXPEDIUM POLYAXIAL 6X50M (Screw) ×5 IMPLANT
SCREW SET SINGLE INNER (Screw) ×6 IMPLANT
SPONGE GAUZE 4X4 12PLY (GAUZE/BANDAGES/DRESSINGS) ×2 IMPLANT
SPONGE LAP 4X18 X RAY DECT (DISPOSABLE) IMPLANT
SPONGE SURGIFOAM ABS GEL 100 (HEMOSTASIS) IMPLANT
STRIP CLOSURE SKIN 1/2X4 (GAUZE/BANDAGES/DRESSINGS) ×3 IMPLANT
STRIP NEXOSS 5CC (Neuro Prosthesis/Implant) ×1 IMPLANT
SUT VIC AB 0 CT1 18XCR BRD8 (SUTURE) ×2 IMPLANT
SUT VIC AB 0 CT1 8-18 (SUTURE) ×4
SUT VIC AB 2-0 CP2 18 (SUTURE) ×4 IMPLANT
SUT VIC AB 3-0 SH 8-18 (SUTURE) ×4 IMPLANT
SYR 20CC LL (SYRINGE) ×2 IMPLANT
SYR 20ML ECCENTRIC (SYRINGE) ×1 IMPLANT
SYR CONTROL 10ML LL (SYRINGE) IMPLANT
TAPE CLOTH SURG 4X10 WHT LF (GAUZE/BANDAGES/DRESSINGS) ×1 IMPLANT
TOWEL OR 17X24 6PK STRL BLUE (TOWEL DISPOSABLE) ×2 IMPLANT
TOWEL OR 17X26 10 PK STRL BLUE (TOWEL DISPOSABLE) ×2 IMPLANT
TRAP SPECIMEN MUCOUS 40CC (MISCELLANEOUS) ×2 IMPLANT
TRAY FOLEY CATH 14FRSI W/METER (CATHETERS) ×2 IMPLANT
WATER STERILE IRR 1000ML POUR (IV SOLUTION) ×2 IMPLANT

## 2013-06-18 NOTE — Interval H&P Note (Signed)
History and Physical Interval Note:  06/18/2013 7:37 AM  Joel Mcneil  has presented today for surgery, with the diagnosis of Stenosis, Scoliosis, Spondylosis, radiculopathy  The various methods of treatment have been discussed with the patient and family. After consideration of risks, benefits and other options for treatment, the patient has consented to  Procedure(s) with comments: POSTERIOR LUMBAR FUSION 2 LEVEL (N/A) - L2-3 L3-4 Posterior lumbar interbordy fusion/sabre cages/expedium screws/Right L1-2 Laminectomy as a surgical intervention .  The patient's history has been reviewed, patient examined, no change in status, stable for surgery.  I have reviewed the patient's chart and labs.  Questions were answered to the patient's satisfaction.     Virna Livengood R

## 2013-06-18 NOTE — Op Note (Signed)
06/18/2013  12:07 PM  PATIENT:  Joel Mcneil  67 y.o. male  PRE-OPERATIVE DIAGNOSIS: L2-3, L3-4  Stenosis, Scoliosis, Spondylosis, radiculopathy, prior surgery  POST-OPERATIVE DIAGNOSIS:L2-3, L3-4  Stenosis, Scoliosis, Spondylosis, radiculopathy,prior surgery  PROCEDURE:  Procedure(s): Redo Decompressive laminectomy , decompressing the L2, L3 and L4 roots (3 levels) -  Segmented pedicle screw fixation L2-4 ,   posteriorlateral fusion L2-4 , autograft, allograft, bone marrow aspirate,  microdisection with microscope  SURGEON:  Surgeon(s): Clydene Fake, MD Hewitt Shorts, MD-assist    ANESTHESIA:   general  EBL:  Total I/O In: 3375 [I.V.:3100; Blood:275] Out: 950 [Urine:150; Blood:800]  BLOOD ADMINISTERED:275 CC CELLSAVER  DRAINS: none   SPECIMEN:  No Specimen  DICTATION: Patient with back and leg pain. 2 prior lumbar fusion lumbar spine patient was then removed procedure he's been a long period with back and leg pain treated with pain management symptoms are worsening samplings activity and workup with MRI and x-rays were done showing severe spinal changes above his prior fusion with scoliosis severe stenosis .   Patient is a redo decompression fusion lumbar spine.  Patient brought into operating room general anesthesia induced patient placed in prone position Wilson frame all pressure points padded. Patient prepped draped sterile fashion segments inject with 20 cc 1% lidocaine with epinephrine. Incision was then made centered previous scar operative and extended cephalad slightly incision taken of the fascia hemostasis obtained with Bovie position fascia incised and subperiosteal dissection done over the spinous process lamina to the facets from the prior fusion mass in we exposed the transverse processes of L23 and 4 released the fusion mass at the to was at L4. Markers were placed at the pedicle entry points and the or laminar spaces were exposed via an x-rays obtained and  this confirmed our positioning. Decompressive laminectomy was then done removing the spinous problems the spinous processes of 2 and the lamina all the L3 and top of L4 which was the middle of the previous fusion mass carefully dissected this was all done with Leksell rongeurs Kerrison punches and high-speed drill. There is significant scoliosis and significant stenosis of the skull complicated the decompression. We carefully decompressed the central canal and to laterally and decompress the foramen. The right side all the L2 lamina was removed up to the bottom of the L1 lamina to explore the excised [facility] descriptor later in the case the in and foraminotomies were done and we made sure we decompressed the L2-L3 and L4 nerve root bilaterally the down side of the scoliosis was and the right-sided it to 3 in the left-sided 3 for an and those were concentrated on making sure the nerve roots were well decompressed and exited the foramen well. We were finished we did decompression central canal and the nerve root was described above. We then explored the disc spaces at 2 3-3/4 probenecid disc bulging and to to burn was seen to flatten out but we couldn't incised the space and into the disc space we were able to pack year side at the level so we aborted attempt to try interbody spacers. Microscope was brought in for microdissection to again make sure the nerve root for decompressed especially the left side and L3-4 with right-sided L2 through in and we explored up above the to 3 disc looking for a fragment of disc that looked like on MRI was coming from the L1-2 level going caudally gutters centrally and slightly to the right we did see the bony ligament which  we opened up and then a few strands admit disc interpreted A large fragment but this will right-sided the canal was well decompressed at both laminectomy and dissect last explore the epidural space. The hemostasis Gelfoam thrombin this then irrigated out later in  the case. High-speed drill was used to decorticate lateral facets and transverse processes of L2-4 bilaterally and then using intraoperative landmarks and intraoperative fluoroscopy pedicle entry points were found we used a high-speed drill to start a small hole pedicle probe placed down the pedicle checked with a small ball probe and sure he did bony circumference we aspirated bone marrow aspirate which we placed on and allograft sponge tapped the pedicle then placed pedicle screws. Screws were placed into L2 and L3 and L4 bilaterally. Final AP and lateral fluoroscopic imaging was then obtained showing good position the pedicle screws. Roger placed in the screw heads locking nuts placed and these were final tightened. All the autograft bone was removed during the laminectomy was cleaned from soft tissue chart the small pieces we also save the bone from the drilling during her decompression and this was mixed unit and packed the posterior abductors for posterior lateral fusion L2-4. He allograft sponge soaked in bone marrow aspirate was also packed the posterior interspaces for posterior fusion L2 to 4. We then reexplored the dura nerve roots we did decompression the placed in Gelfoam over the lateral gutters to stopping the fragment from impinging in the nerve roots retractors removed we had very good hemostasis fascia closed with 0 Vicryl interrupted sutures subcutaneous tissue closed with 021 through Vicryl interrupted sutures skin closed benzoin Steri-Strips dressing was placed patient placed back in spine position woken from anesthesia and transferred recovery.  PLAN OF CARE: Admit to inpatient   PATIENT DISPOSITION:  PACU - hemodynamically stable.

## 2013-06-18 NOTE — Anesthesia Postprocedure Evaluation (Signed)
  Anesthesia Post-op Note  Patient: Chinita Greenland  Procedure(s) Performed: Procedure(s) with comments: LUMBAR TWO-THREE, LUMBAR THREE FOUR POSTERIOR LUMBAR FUSION 2 LEVEL,  (N/A) - Lumbar Two-Three Lumbar Three-Four Posterior lumbar interbordy fusion/sabre cages/expedium screws/Right Lumbar One Two Laminectomy  Patient Location: PACU  Anesthesia Type:General  Level of Consciousness: awake, alert  and oriented  Airway and Oxygen Therapy: Patient Spontanous Breathing and Patient connected to nasal cannula oxygen  Post-op Pain: mild  Post-op Assessment: Post-op Vital signs reviewed, Patient's Cardiovascular Status Stable, Respiratory Function Stable, Patent Airway and Pain level controlled  Post-op Vital Signs: stable  Complications: No apparent anesthesia complications

## 2013-06-18 NOTE — H&P (Signed)
See H& P.

## 2013-06-18 NOTE — Anesthesia Preprocedure Evaluation (Addendum)
Anesthesia Evaluation  Patient identified by MRN, date of birth, ID band Patient awake    Reviewed: Allergy & Precautions, H&P , NPO status , Patient's Chart, lab work & pertinent test results, reviewed documented beta blocker date and time   Airway Mallampati: II TM Distance: >3 FB Neck ROM: Full    Dental  (+) Partial Upper and Dental Advisory Given   Pulmonary  breath sounds clear to auscultation        Cardiovascular Rhythm:Regular Rate:Normal     Neuro/Psych    GI/Hepatic   Endo/Other    Renal/GU      Musculoskeletal   Abdominal   Peds  Hematology   Anesthesia Other Findings   Reproductive/Obstetrics                           Anesthesia Physical Anesthesia Plan  ASA: III  Anesthesia Plan: General   Post-op Pain Management:    Induction: Intravenous  Airway Management Planned: Oral ETT  Additional Equipment:   Intra-op Plan:   Post-operative Plan: Extubation in OR  Informed Consent: I have reviewed the patients History and Physical, chart, labs and discussed the procedure including the risks, benefits and alternatives for the proposed anesthesia with the patient or authorized representative who has indicated his/her understanding and acceptance.   Dental advisory given  Plan Discussed with: CRNA and Anesthesiologist  Anesthesia Plan Comments: (Lumbar Spondylosis Htn H/O nonobstructive CAD No angina,  H/O post-op nausea and vomiting  Plan GA with oral ETT  Kipp Brood, MD)       Anesthesia Quick Evaluation

## 2013-06-18 NOTE — Preoperative (Signed)
Beta Blockers   Reason not to administer Beta Blockers:Not Applicable 

## 2013-06-18 NOTE — Anesthesia Procedure Notes (Signed)
Procedure Name: Intubation Performed by: Armandina Gemma Pre-anesthesia Checklist: Patient identified, Timeout performed, Emergency Drugs available, Suction available and Patient being monitored Patient Re-evaluated:Patient Re-evaluated prior to inductionOxygen Delivery Method: Circle system utilized Preoxygenation: Pre-oxygenation with 100% oxygen Intubation Type: IV induction Ventilation: Mask ventilation without difficulty Laryngoscope Size: Miller and 2 Grade View: Grade I Tube type: Oral Tube size: 7.5 mm Number of attempts: 1 Airway Equipment and Method: Stylet Placement Confirmation: ETT inserted through vocal cords under direct vision,  breath sounds checked- equal and bilateral and positive ETCO2 Secured at: 22 cm Tube secured with: Tape Dental Injury: Teeth and Oropharynx as per pre-operative assessment  Comments: IV induction Joslin- intubation AM CRNA atraumatic

## 2013-06-18 NOTE — Progress Notes (Signed)
Patient tx to 4N20, incision CDI . Made comfortable in bed.

## 2013-06-18 NOTE — Transfer of Care (Signed)
Immediate Anesthesia Transfer of Care Note  Patient: Joel Mcneil  Procedure(s) Performed: Procedure(s) with comments: LUMBAR TWO-THREE, LUMBAR THREE FOUR POSTERIOR LUMBAR FUSION 2 LEVEL,  (N/A) - Lumbar Two-Three Lumbar Three-Four Posterior lumbar interbordy fusion/sabre cages/expedium screws/Right Lumbar One Two Laminectomy  Patient Location: PACU  Anesthesia Type:General  Level of Consciousness: awake and alert   Airway & Oxygen Therapy: Patient Spontanous Breathing and Patient connected to nasal cannula oxygen  Post-op Assessment: Report given to PACU RN and Post -op Vital signs reviewed and stable  Post vital signs: Reviewed and stable  Complications: No apparent anesthesia complications

## 2013-06-18 NOTE — Progress Notes (Signed)
UR COMPLETED  

## 2013-06-19 MED ORDER — MORPHINE SULFATE 2 MG/ML IJ SOLN
2.0000 mg | INTRAMUSCULAR | Status: DC | PRN
  Administered 2013-06-20: 2 mg via INTRAVENOUS
  Filled 2013-06-19: qty 1

## 2013-06-19 MED FILL — Sodium Chloride IV Soln 0.9%: INTRAVENOUS | Qty: 3000 | Status: AC

## 2013-06-19 MED FILL — Heparin Sodium (Porcine) Inj 1000 Unit/ML: INTRAMUSCULAR | Qty: 30 | Status: AC

## 2013-06-19 NOTE — Evaluation (Signed)
Physical Therapy Evaluation Patient Details Name: Joel Mcneil MRN: [id number] DOB: 05-05-1946 Today's Date: 06/19/2013 Time: 4098-1191 PT Time Calculation (min): 23 min  PT Assessment / Plan / Recommendation History of Present Illness  67 yo male s/p L2-3 L3-4 PLIF by MD Phoebe Perch with past medical hx of x 3 back surgeries (1987, 1989, 1993)  Clinical Impression  Patient is s/p PLIF resulting in the deficits listed below (see PT Problem List).  Patient will benefit from skilled PT to increase their independence and safety with mobility (while adhering to their precautions) to allow discharge home with support from wife.      PT Assessment  Patient needs continued PT services    Follow Up Recommendations  Supervision for mobility/OOB;Home health PT    Does the patient have the potential to tolerate intense rehabilitation      Barriers to Discharge        Equipment Recommendations  Rolling walker with 5" wheels    Recommendations for Other Services     Frequency Min 5X/week    Precautions / Restrictions Precautions Precautions: Back, Fall (reports a h/o falls, 2 in the last month) Required Braces or Orthoses: Spinal Brace Spinal Brace: Thoracolumbosacral orthotic;Applied in sitting position   Pertinent Vitals/Pain Reports moderate surgical pain. Repositioned and improved with ambulation      Mobility  Bed Mobility Bed Mobility: Not assessed Transfers Sit to Stand: 4: Min assist;With upper extremity assist;From chair/3-in-1 (due to posterior lean) Stand to Sit: 4: Min guard;With upper extremity assist;To chair/3-in-1 Details for Transfer Assistance: pt educated on rw safety and correct hand placement . pt upon initial sit<>Stand with LoB posteriorly requiring upper extremity support Ambulation/Gait Ambulation/Gait Assistance: 4: Min guard Ambulation Distance (Feet): 700 Feet Assistive device: Rolling walker Ambulation/Gait Assistance Details: cues for safe positioning  and technique with RW Gait Pattern: Step-through pattern Gait velocity: decreased General Gait Details: downward gaze Stairs: No    Exercises     PT Diagnosis: Difficulty walking;Acute pain  PT Problem List: Decreased activity tolerance;Decreased balance;Decreased knowledge of use of DME;Decreased knowledge of precautions;Pain PT Treatment Interventions: DME instruction;Gait training;Functional mobility training;Therapeutic activities;Therapeutic exercise;Balance training;Patient/family education     PT Goals(Current goals can be found in the care plan section) Acute Rehab PT Goals Patient Stated Goal: to get back to walking PT Goal Formulation: With patient Time For Goal Achievement: 06/26/13 Potential to Achieve Goals: Good  Visit Information  Last PT Received On: 06/19/13 Assistance Needed: +1 History of Present Illness: 67 yo male s/p L2-3 L3-4 PLIF by MD Phoebe Perch with past medical hx of x 3 back surgeries (1987, 1989, 1993)       Prior Functioning  Home Living Family/patient expects to be discharged to:: Private residence Living Arrangements: Spouse/significant other Available Help at Discharge: Family Type of Home: House Home Access: Stairs to enter Secretary/administrator of Steps: 4 Home Layout: One level Home Equipment: Cane - single point Additional Comments: Pt has wife (A) at [employer] and wife is a former Engineer, civil (consulting). Pt c/o positioning of TLSO brace and that it sits too close to neck. Pt with no c/o when in standing. pt requesting hospital bed because he wants an additional option other than tv in chair or laying in bed. Pt educated that lazy boy recliner is not allowed to recline. Pt educated with demo how recliner is not the same as elevating the knee in supine with hospital bed. Pt recalled 2 out 3 back precautions from memory. Pt could benefit from RW. Pt  with x2 falls prior to admission from walking in yard. Pt is unable to cross bil LE and thus total (A) for LB dressing at  this time. Pt  needs total (A) for don brace. Next session to focus on LB dressing with AE, tub transfer, and don doff brace. Pt requesting to take a bath and educated that he could not sit in a tub for > 4 weeks and to discuss with MD on follow up.  Prior Function Level of Independence: Independent Communication Communication: No difficulties Dominant Hand: Right    Cognition  Cognition Arousal/Alertness: Awake/alert Behavior During Therapy: WFL for tasks assessed/performed Overall Cognitive Status: Within Functional Limits for tasks assessed    Extremity/Trunk Assessment Upper Extremity Assessment Upper Extremity Assessment: Overall WFL for tasks assessed Lower Extremity Assessment Lower Extremity Assessment: Generalized weakness Cervical / Trunk Assessment Cervical / Trunk Assessment: Normal   Balance Balance Balance Assessed: Yes Static Standing Balance Static Standing - Balance Support: Bilateral upper extremity supported Static Standing - Level of Assistance: 5: Stand by assistance  End of Session PT - End of Session Equipment Utilized During Treatment: Gait belt Activity Tolerance: Patient tolerated treatment well Patient left: in chair;with call bell/phone within reach;with family/visitor present Nurse Communication: Mobility status  GP     [facility] HELEN 06/19/2013, 1:42 PM

## 2013-06-19 NOTE — Progress Notes (Signed)
Doing well. C/o appropriate incisional soreness. No leg pain No Numbness, tingling, weakness Not OOB yetl  Temp:  [97.6 F (36.4 C)-98.7 F (37.1 C)] 98.1 F (36.7 C) (07/18 0530) Pulse Rate:  [50-79] 65 (07/18 0530) Resp:  [10-20] 20 (07/18 0530) BP: (110-136)/(53-69) 119/66 mmHg (07/18 0530) SpO2:  [96 %-100 %] 96 % (07/18 0530) Good strength and sensation Incision CDI  Plan: Increase activity  - po meds

## 2013-06-19 NOTE — Evaluation (Signed)
Occupational Therapy Evaluation Patient Details Name: Joel Mcneil MRN: [id number] DOB: [birth year] Today's Date: 06/19/2013 Time: 9147-8295 OT Time Calculation (min): 23 min  OT Assessment / Plan / Recommendation History of present illness 67 yo male s/p L2-3 L3-4 PLIF by MD Phoebe Perch with past medical hx of x 3 back surgeries (1987, 1989, 1993)   Clinical Impression   Patient is s/p L2-4 PLIF surgery resulting in functional limitations due to the deficits listed below (see OT problem list).  Patient will benefit from skilled OT acutely to increase independence and safety with ADLS to allow discharge HHOT, RW, and 3n1.     OT Assessment  Patient needs continued OT Services    Follow Up Recommendations  Home health OT    Barriers to Discharge      Equipment Recommendations  3 in 1 bedside comode;Other (comment) (pt and wife agree- RW)    Recommendations for Other Services    Frequency  Min 2X/week    Precautions / Restrictions Precautions Precautions: Back Required Braces or Orthoses: Spinal Brace Spinal Brace: Thoracolumbosacral orthotic;Applied in sitting position   Pertinent Vitals/Pain Minimal at this time    ADL  Eating/Feeding: Modified independent Where Assessed - Eating/Feeding: Chair Grooming: Wash/dry face;Wash/dry hands;Modified independent Where Assessed - Grooming: Supported sitting Upper Body Dressing: Moderate assistance Where Assessed - Upper Body Dressing: Supported sitting Lower Body Dressing: +1 Total assistance Where Assessed - Lower Body Dressing: Supported sitting Toilet Transfer: Minimal assistance Toilet Transfer Method: Sit to stand Toilet Transfer Equipment: Raised toilet seat with arms (or 3-in-1 over toilet) Toileting - Clothing Manipulation and Hygiene: Moderate assistance Where Assessed - Toileting Clothing Manipulation and Hygiene: Sit to stand from 3-in-1 or toilet Tub/Shower Transfer: Other (comment) (to be assessed) Equipment Used:  Back brace;Gait belt;Rolling walker Transfers/Ambulation Related to ADLs: Pt ambulating with incr upright posture and stability with RW. Recommend RW for dc home. pt wtih x2 falls just prior to admission. pt reports no pain in right LE at this time ADL Comments: Pt educated on back precautions and adls. Pt throughout session provided detailed education from previous surgeries in 1987  and 1993. Pt educated that due to changes in medicine and best practice our education has changed to help patient progress faster/ better. Pt wanting hospital bed . Pt educated on pad changes. Wife reports pt is allowed to shower out of brace and must place brace back on immediately after showering. Pt requesting to take bath in two weeks. Pt advised to follow up with md and that it will be > 4 weeks. Pt wants to progress fast and educated that recovery takes 8-12 weeks. Pt has wife assistance and wife demonstrates strong awareness to pts back precautions.     OT Diagnosis: Generalized weakness;Acute pain  OT Problem List: Decreased strength;Decreased activity tolerance;Impaired balance (sitting and/or standing);Decreased safety awareness;Decreased knowledge of use of DME or AE;Decreased knowledge of precautions;Pain OT Treatment Interventions: Self-care/ADL training;DME and/or AE instruction;Therapeutic activities;Patient/family education;Balance training   OT Goals(Current goals can be found in the care plan section) Acute Rehab OT Goals Patient Stated Goal: to get back to walking OT Goal Formulation: With patient/family Time For Goal Achievement: 07/03/13 Potential to Achieve Goals: Good  Visit Information  Last OT Received On: 06/19/13 Assistance Needed: +1 PT/OT Co-Evaluation/Treatment: Yes History of Present Illness: 67 yo male s/p L2-3 L3-4 PLIF by MD Phoebe Perch with past medical hx of x 3 back surgeries (1987, 1989, 1993)       Prior Functioning  Home Living Family/patient expects to be discharged to::  Private residence Living Arrangements: Spouse/significant other Available Help at Discharge: Family Type of Home: House Home Access: Stairs to enter Secretary/administrator of Steps: 4 Home Layout: One level Home Equipment: Cane - single point Additional Comments: Pt has wife (A) at [employer] and wife is a former Engineer, civil (consulting). Pt c/o positioning of TLSO brace and that it sits too close to neck. Pt with no c/o when in standing. pt requesting hospital bed because he wants an additional option other than tv in chair or laying in bed. Pt educated that lazy boy recliner is not allowed to recline. Pt educated with demo how recliner is not the same as elevating the knee in supine with hospital bed. Pt recalled 2 out 3 back precautions from memory. Pt could benefit from RW. Pt with x2 falls prior to admission from walking in yard. Pt is unable to cross bil LE and thus total (A) for LB dressing at this time. Pt  needs total (A) for don brace. Next session to focus on LB dressing with AE, tub transfer, and don doff brace. Pt requesting to take a bath and educated that he could not sit in a tub for > 4 weeks and to discuss with MD on follow up.  Prior Function Level of Independence: Independent Communication Communication: No difficulties Dominant Hand: Right         Vision/Perception Vision - Assessment Vision Assessment: Vision not tested   Cognition  Cognition Arousal/Alertness: Awake/alert Behavior During Therapy: WFL for tasks assessed/performed Overall Cognitive Status: Within Functional Limits for tasks assessed    Extremity/Trunk Assessment Upper Extremity Assessment Upper Extremity Assessment: Overall WFL for tasks assessed Lower Extremity Assessment Lower Extremity Assessment: Defer to PT evaluation Cervical / Trunk Assessment Cervical / Trunk Assessment: Normal     Mobility Bed Mobility Bed Mobility: Not assessed Transfers Transfers: Sit to Stand;Stand to Sit Sit to Stand: 4: Min  assist;With upper extremity assist;From chair/3-in-1 (due to posterior lean) Stand to Sit: 4: Min guard;With upper extremity assist;To chair/3-in-1 Details for Transfer Assistance: pt educated on rw safety . pt upon initial sit<>Stand with LoB posteriorly. Pt with incr static standing during session     Exercise     Balance     End of Session OT - End of Session Activity Tolerance: Patient tolerated treatment well Patient left: in chair;with call bell/phone within reach Nurse Communication: Mobility status;Precautions  GO     Lucile Shutters 06/19/2013, 1:27 PM Pager: [phone]

## 2013-06-19 NOTE — Clinical Social Work Note (Signed)
Clinical Social Worker received referral for possible ST-SNF placement.  Chart reviewed.  PT/OT recommending home with home health.  Spoke with RN Case Manager who will follow up with patient to discuss home health needs.    CSW signing off - please re consult if social work needs arise.  Jesse Daeton Kluth, LCSW [phone] 

## 2013-06-20 NOTE — Progress Notes (Signed)
Occupational Therapy Treatment Patient Details Name: JERIN FRANZEL MRN: [id number] DOB: 1946-05-27 Today's Date: 06/20/2013 Time: 6295-2841 OT Time Calculation (min): 15 min  OT Assessment / Plan / Recommendation  OT comments  Pt making excellent progress and will have A prn at home. All education on acute care completed. Acute OT will sign off.  Follow Up Recommendations  Home health OT       Equipment Recommendations  3 in 1 bedside comode (RW)       Frequency Min 2X/week   Progress towards OT Goals Progress towards OT goals: Progressing toward goals  Plan Discharge plan remains appropriate    Precautions / Restrictions Precautions Precautions: Back Required Braces or Orthoses: Spinal Brace Spinal Brace: Thoracolumbosacral orthotic;Applied in sitting position Restrictions Weight Bearing Restrictions: No       ADL  Lower Body Dressing: Performed;Supervision/safety;Set up;Simulated (with AE) Where Assessed - Lower Body Dressing: Unsupported sit to stand Tub/Shower Transfer: Performed;Supervision/safety Tub/Shower Transfer Method:  (side step and seat on seat) Equipment Used: Rolling walker;Back brace;Reacher;Sock aid Transfers/Ambulation Related to ADLs: S for sit<>stand and Min guard A for ambulation with RW ADL Comments: Pt educated on use of reacher and sock aid for LBADLs and where to get them if he so desires. Family will pick up tub seat on their own.      OT Goals(current goals can now be found in the care plan section) Acute Rehab OT Goals Patient Stated Goal: to get back to walking  Visit Information  Last OT Received On: 06/20/13 Assistance Needed: +1 History of Present Illness: 67 yo male s/p L2-3 L3-4 PLIF by MD Phoebe Perch with past medical hx of x 3 back surgeries (1987, 1989, 1993)          Cognition  Cognition Arousal/Alertness: Awake/alert Behavior During Therapy: WFL for tasks assessed/performed Overall Cognitive Status: Within Functional Limits  for tasks assessed    Mobility  Bed Mobility Bed Mobility: Rolling Left;Left Sidelying to Sit Rolling Left: 4: Min guard Left Sidelying to Sit: 5: Supervision;HOB flat Details for Bed Mobility Assistance: Pt up in recliner upon arrival Transfers Transfers: Sit to Stand;Stand to Sit Sit to Stand: 5: Supervision;With upper extremity assist;With armrests;From chair/3-in-1 Stand to Sit: 5: Supervision;With upper extremity assist;With armrests;To chair/3-in-1 Details for Transfer Assistance: cues for safe hand placement          End of Session OT - End of Session Equipment Utilized During Treatment: Gait belt;Rolling walker Activity Tolerance: Patient tolerated treatment well Patient left: in chair;with call bell/phone within reach;with family/visitor present       Evette Georges [phone] 06/20/2013, 2:46 PM

## 2013-06-20 NOTE — Progress Notes (Signed)
Physical Therapy Treatment Patient Details Name: Joel Mcneil MRN: [id number] DOB: 1946/03/17 Today's Date: 06/20/2013 Time: 4098-1191 PT Time Calculation (min): 25 min  PT Assessment / Plan / Recommendation  PT Comments   Progressing nicely today. Lower extremities still appear weak however supported well by the RW during gait. Reviewed education concerning back precautions. Wife is a Engineer, civil (consulting) and appears prepared to take him home.   Follow Up Recommendations  Supervision for mobility/OOB;Home health PT     Does the patient have the potential to tolerate intense rehabilitation     Barriers to Discharge        Equipment Recommendations  Rolling walker with 5" wheels    Recommendations for Other Services    Frequency Min 5X/week   Progress towards PT Goals Progress towards PT goals: Progressing toward goals  Plan Current plan remains appropriate    Precautions / Restrictions Precautions Precautions: Back Required Braces or Orthoses: Spinal Brace Spinal Brace: Thoracolumbosacral orthotic;Applied in sitting position Restrictions Weight Bearing Restrictions: No   Pertinent Vitals/Pain Reports 4/10 back pain, RN made aware the patient doesn't feel his oral pain meds are working    Mobility  Bed Mobility Bed Mobility: Rolling Left;Left Sidelying to Sit Rolling Left: 4: Min guard Left Sidelying to Sit: 5: Supervision;HOB flat Details for Bed Mobility Assistance: cues for sequencing Transfers Transfers: Sit to Stand;Stand to Sit Sit to Stand: 5: Supervision;With upper extremity assist Stand to Sit: 5: Supervision;With upper extremity assist Details for Transfer Assistance: cues for safe hand placement Ambulation/Gait Ambulation/Gait Assistance: 4: Min guard Ambulation Distance (Feet): 750 Feet Assistive device: Rolling walker Ambulation/Gait Assistance Details: cues for tall posture and safe technique with RW especially during turns; gaurdign for stability Gait Pattern:  Step-through pattern General Gait Details: ambulates on the lateral borders of his feet with slight increase in ankle instability, compensates well with the RW Stairs: Yes Stairs Assistance: 5: Supervision Stairs Assistance Details (indicate cue type and reason): cues for technique Stair Management Technique: Two rails;Alternating pattern Number of Stairs: 4      PT Goals (current goals can now be found in the care plan section) Acute Rehab PT Goals Patient Stated Goal: to get back to walking  Visit Information  Last PT Received On: 06/20/13 Assistance Needed: +1 History of Present Illness: 67 yo male s/p L2-3 L3-4 PLIF by MD Phoebe Perch with past medical hx of x 3 back surgeries (1987, 1989, 1993)    Subjective Data  Patient Stated Goal: to get back to walking   Cognition  Cognition Arousal/Alertness: Awake/alert Behavior During Therapy: WFL for tasks assessed/performed Overall Cognitive Status: Within Functional Limits for tasks assessed    Balance     End of Session PT - End of Session Equipment Utilized During Treatment: Gait belt Activity Tolerance: Patient tolerated treatment well Patient left: in chair;with call bell/phone within reach;with family/visitor present Nurse Communication: Mobility status   GP     [facility] HELEN 06/20/2013, 1:52 PM

## 2013-06-20 NOTE — Progress Notes (Signed)
Doing well. C/o appropriate incisional soreness. No leg pain No Numbness, tingling, weakness No Nausea /vomiting Amb/ voiding well  Temp:  [98.2 F (36.8 C)-98.7 F (37.1 C)] 98.2 F (36.8 C) (07/19 0430) Pulse Rate:  [55-69] 63 (07/19 0430) Resp:  [17-20] 20 (07/19 0430) BP: (105-127)/(62-69) 127/66 mmHg (07/19 0430) SpO2:  [96 %-98 %] 98 % (07/19 0430) Good strength and sensation Incision CDI  Plan: Increase activity  - d/c next 1-2 days

## 2013-06-21 MED ORDER — WHITE PETROLATUM GEL
Status: AC
  Administered 2013-06-21: 0.2
  Filled 2013-06-21: qty 5

## 2013-06-21 NOTE — Progress Notes (Signed)
Physical Therapy Treatment Patient Details Name: Joel Mcneil MRN: [id number] DOB: 1946-08-20 Today's Date: 06/21/2013 Time: 4098-1191 PT Time Calculation (min): 32 min  PT Assessment / Plan / Recommendation  PT Comments   Pt presents at modified independent level for therapy today and all goals met on plan of care.  Pt ready for d/c home with very supportive and attentive spouse.  HHPT to follow to continue transition from dependent on ambulatory aid and high fall risk to independent ambulatory with fall risk effectively managed.  Pt thoroughly educated on back precautions and risk of non-adherence including increased risk for surgical revision, pt verbalized understanding.     Follow Up Recommendations  Home health PT     Does the patient have the potential to tolerate intense rehabilitation     Barriers to Discharge        Equipment Recommendations       Recommendations for Other Services    Frequency     Progress towards PT Goals Progress towards PT goals: Goals met/education completed, patient discharged from PT  Plan Current plan remains appropriate;Other (comment) (d/c goals met)    Precautions / Restrictions Precautions Precautions: Back Precaution Comments: No BEND ARCH TWIST; reinforced risk associated with breaking precautions and oberved don/doff TLSO with minimal rotation in spine noted and pt mindful of precautions Required Braces or Orthoses: Spinal Brace Spinal Brace: Thoracolumbosacral orthotic;Applied in sitting position Restrictions Weight Bearing Restrictions: No   Pertinent Vitals/Pain Minimal pain     Mobility  Bed Mobility Bed Mobility: Sit to Sidelying Right;Right Sidelying to Sit Right Sidelying to Sit: 6: Modified independent (Device/Increase time);HOB flat Sit to Sidelying Right: 6: Modified independent (Device/Increase time);HOB flat Details for Bed Mobility Assistance: Simulated home by increasing bed height and lowering rail; pt able to  demonstrate technique without need for assist or cues; does struggle with pain when rolling onto back, provided instruction for optimal pain control o/w independent as noted Transfers Transfers: Sit to Stand;Stand to Sit Sit to Stand: 6: Modified independent (Device/Increase time);With upper extremity assist;From bed;From chair/3-in-1 Stand to Sit: 6: Modified independent (Device/Increase time);With upper extremity assist;To bed;To chair/3-in-1 Ambulation/Gait Ambulation/Gait Assistance: 5: Supervision Ambulation Distance (Feet): 150 Feet Assistive device: None Ambulation/Gait Assistance Details: observed walking without device with education on benefits of using vs. not using device including high fall risk on uneven surfaces and longer distance and instructed pt to use RW unless o/w instructed by HHPT.  Denies leg weakness or instability or increased back pain without support of RW during ambulation;  gait mechanics acceptable and safe.  Performed DGI as noted in BALANCE section. Gait Pattern: Step-through pattern Gait velocity: 2.36 ft/sec    Exercises     PT Diagnosis:    PT Problem List:   PT Treatment Interventions:     PT Goals (current goals can now be found in the care plan section)    Visit Information  Last PT Received On: 06/21/13 Assistance Needed: +1 History of Present Illness: 67 yo male s/p L2-3 L3-4 PLIF by MD Phoebe Perch with past medical hx of x 3 back surgeries (1987, 1989, 1993)    Subjective Data      Cognition  Cognition Arousal/Alertness: Awake/alert Behavior During Therapy: WFL for tasks assessed/performed Overall Cognitive Status: Within Functional Limits for tasks assessed    Balance  Balance Balance Assessed: Yes Standardized Balance Assessment Standardized Balance Assessment: Dynamic Gait Index Dynamic Gait Index Level Surface: Mild Impairment Change in Gait Speed: Normal Gait with Horizontal Head Turns:  Normal Gait with Vertical Head Turns:  Normal Gait and Pivot Turn: Mild Impairment Step Over Obstacle: Mild Impairment Step Around Obstacles: Mild Impairment Steps: Mild Impairment Total Score: 19  End of Session PT - End of Session Equipment Utilized During Treatment: Back brace Activity Tolerance: Patient tolerated treatment well Patient left: in chair;with call bell/phone within reach;with family/visitor present Nurse Communication: Mobility status   GP     Dennis Bast 06/21/2013, 10:17 AM

## 2013-06-21 NOTE — Progress Notes (Signed)
Patient has ambulated 3x down unit so far today, and has been practicing donning and doffing his brace by himself

## 2013-06-21 NOTE — Progress Notes (Signed)
Doing well. C/o appropriate incisional soreness.   Temp:  [98.2 F (36.8 C)-99.2 F (37.3 C)] 99.2 F (37.3 C) (07/20 0529) Pulse Rate:  [65-73] 73 (07/20 0529) Resp:  [18-20] 20 (07/20 0529) BP: (109-125)/(54-67) 115/54 mmHg (07/20 0529) SpO2:  [95 %-98 %] 95 % (07/20 0529) Good strength and sensation Incision CDI  Plan: Continue increasing activity  - ? Home in am

## 2013-06-22 MED ORDER — CYCLOBENZAPRINE HCL 10 MG PO TABS: 10.0000 mg | ORAL_TABLET | Freq: Three times a day (TID) | ORAL | Status: AC | PRN

## 2013-06-22 NOTE — Discharge Summary (Signed)
Physician Discharge Summary  Patient ID: ZAE KIRTZ MRN: [id number] DOB/AGE: Apr 07, 1946 67 y.o.  Admit date: 06/18/2013 Discharge date: 06/22/2013  Admission Diagnoses:L2-3, L3-4 Stenosis, Scoliosis, Spondylosis, radiculopathy, prior surgery     L2-3, L3-4 Stenosis, Scoliosis, Spondylosis, radiculopathy, prior surgery     Discharge Diagnoses:  Active Problems:   * No active hospital problems. *   Discharged Condition: good  Hospital Course: pt admitted on day of surgery  - underwent procedure below  - pt doing well  , no leg pain  - ambulating, voiding  Consults: None   Treatments: surgery: PROCEDURE: Procedure(s):  Redo Decompressive laminectomy , decompressing the L2, L3 and L4 roots (3 levels) - Segmented pedicle screw fixation L2-4 , posteriorlateral fusion L2-4 , autograft, allograft, bone marrow aspirate, microdisection with microscope      Discharge Exam: Blood pressure 120/63, pulse 68, temperature 98.6 F (37 C), temperature source Oral, resp. rate 20, height 5\' 11"  (1.803 m), weight 86.637 kg (191 lb), SpO2 98.00%. Wound:c/d/i  Disposition: home     Medication List    STOP taking these medications       celecoxib 200 MG capsule  Commonly known as:  CELEBREX      TAKE these medications       aspirin EC 81 MG tablet  Take 81 mg by mouth daily.     cyclobenzaprine 10 MG tablet  Commonly known as:  FLEXERIL  Take 1 tablet (10 mg total) by mouth 3 (three) times daily as needed for muscle spasms.     gabapentin 600 MG tablet  Commonly known as:  NEURONTIN  Take 600 mg by mouth 3 (three) times daily.     Krill Oil 1000 MG Caps  Take 1,000 mg by mouth every morning.     metoprolol succinate 50 MG 24 hr tablet  Commonly known as:  TOPROL-XL  Take 50 mg by mouth daily. Take with or immediately following a meal.     morphine 15 MG tablet  Commonly known as:  MSIR  Take 15 mg by mouth every 8 (eight) hours.     omeprazole 20 MG capsule   Commonly known as:  PRILOSEC  Take 20 mg by mouth daily.     oxyCODONE 15 MG immediate release tablet  Commonly known as:  ROXICODONE  Take 15 mg by mouth every 6 (six) hours as needed for pain.     tamsulosin 0.4 MG Caps  Commonly known as:  FLOMAX  Take 0.4 mg by mouth daily.     Vitamin D-3 5000 UNITS Tabs  Take 5,000 Units by mouth daily.         SignedClydene Fake, MD 06/22/2013, 10:19 AM

## 2013-06-22 NOTE — Care Management Note (Signed)
    Page 1 of 1   06/22/2013     1:28:18 PM   CARE MANAGEMENT NOTE 06/22/2013  Patient:  Joel Mcneil   Account Number:  [id number]  Date Initiated:  06/22/2013  Documentation initiated by:  Elmer Bales  Subjective/Objective Assessment:   Pt admitted for lumbar surgery     Action/Plan:   Will follow for discharge needs.   Anticipated DC Date:  06/22/2013   Anticipated DC Plan:  HOME W HOME HEALTH SERVICES      DC Planning Services  CM consult      Choice offered to / List presented to:  C-1 Patient   DME arranged  3-N-1  Levan Hurst      DME agency  [facility]        Status of service:  Completed, signed off Medicare Important Message given?   (If response is "NO", the following Medicare IM given date fields will be blank) Date Medicare IM given:   Date Additional Medicare IM given:    Discharge Disposition:  HOME/SELF CARE  Per UR Regulation:  Reviewed for med. necessity/level of care/duration of stay  If discussed at Long Length of Stay Meetings, dates discussed:    Comments:  06/22/13 1115  Elmer Bales RN, MSN, CM- Met with patient to discuss home health needs.  Pt states that Dr Phoebe Perch does not plan for him to have any therapy until after his follow-up appointment.  Rolling walker and 3N1 were ordered for patient to have delivered prior to discharge.  Pt and family denied any additional needs.

## 2013-07-23 IMAGING — CR DG CHEST 2V
2 series · 2 of 2 positions shown · non-contrast
Comparison: 04/20/2009

CLINICAL DATA: Preoperative evaluation for lumbar spine surgery,
history coronary artery disease, hypertension, GERD, former smoker

CHEST - 2 VIEW

[w chest pa]
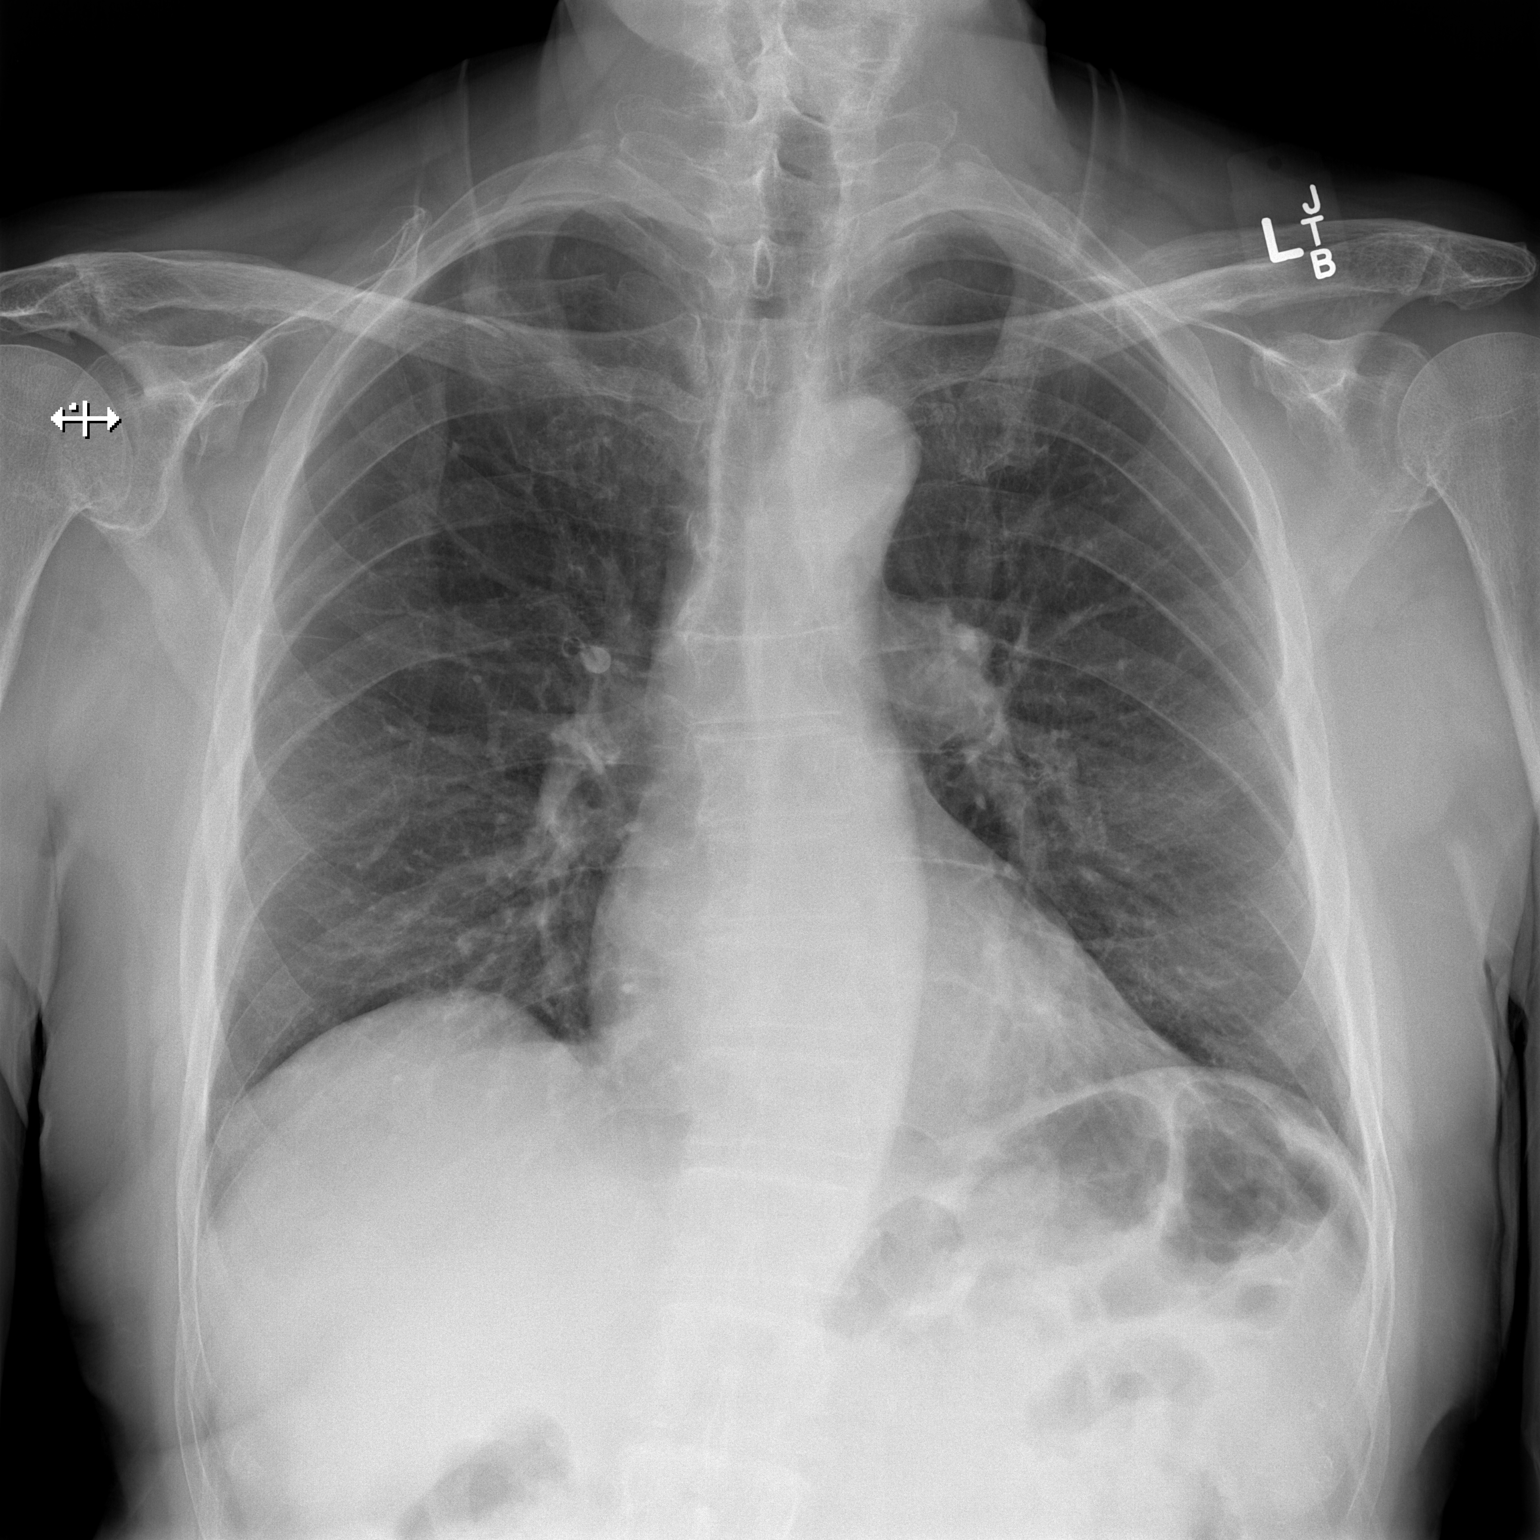

[w chest lat]
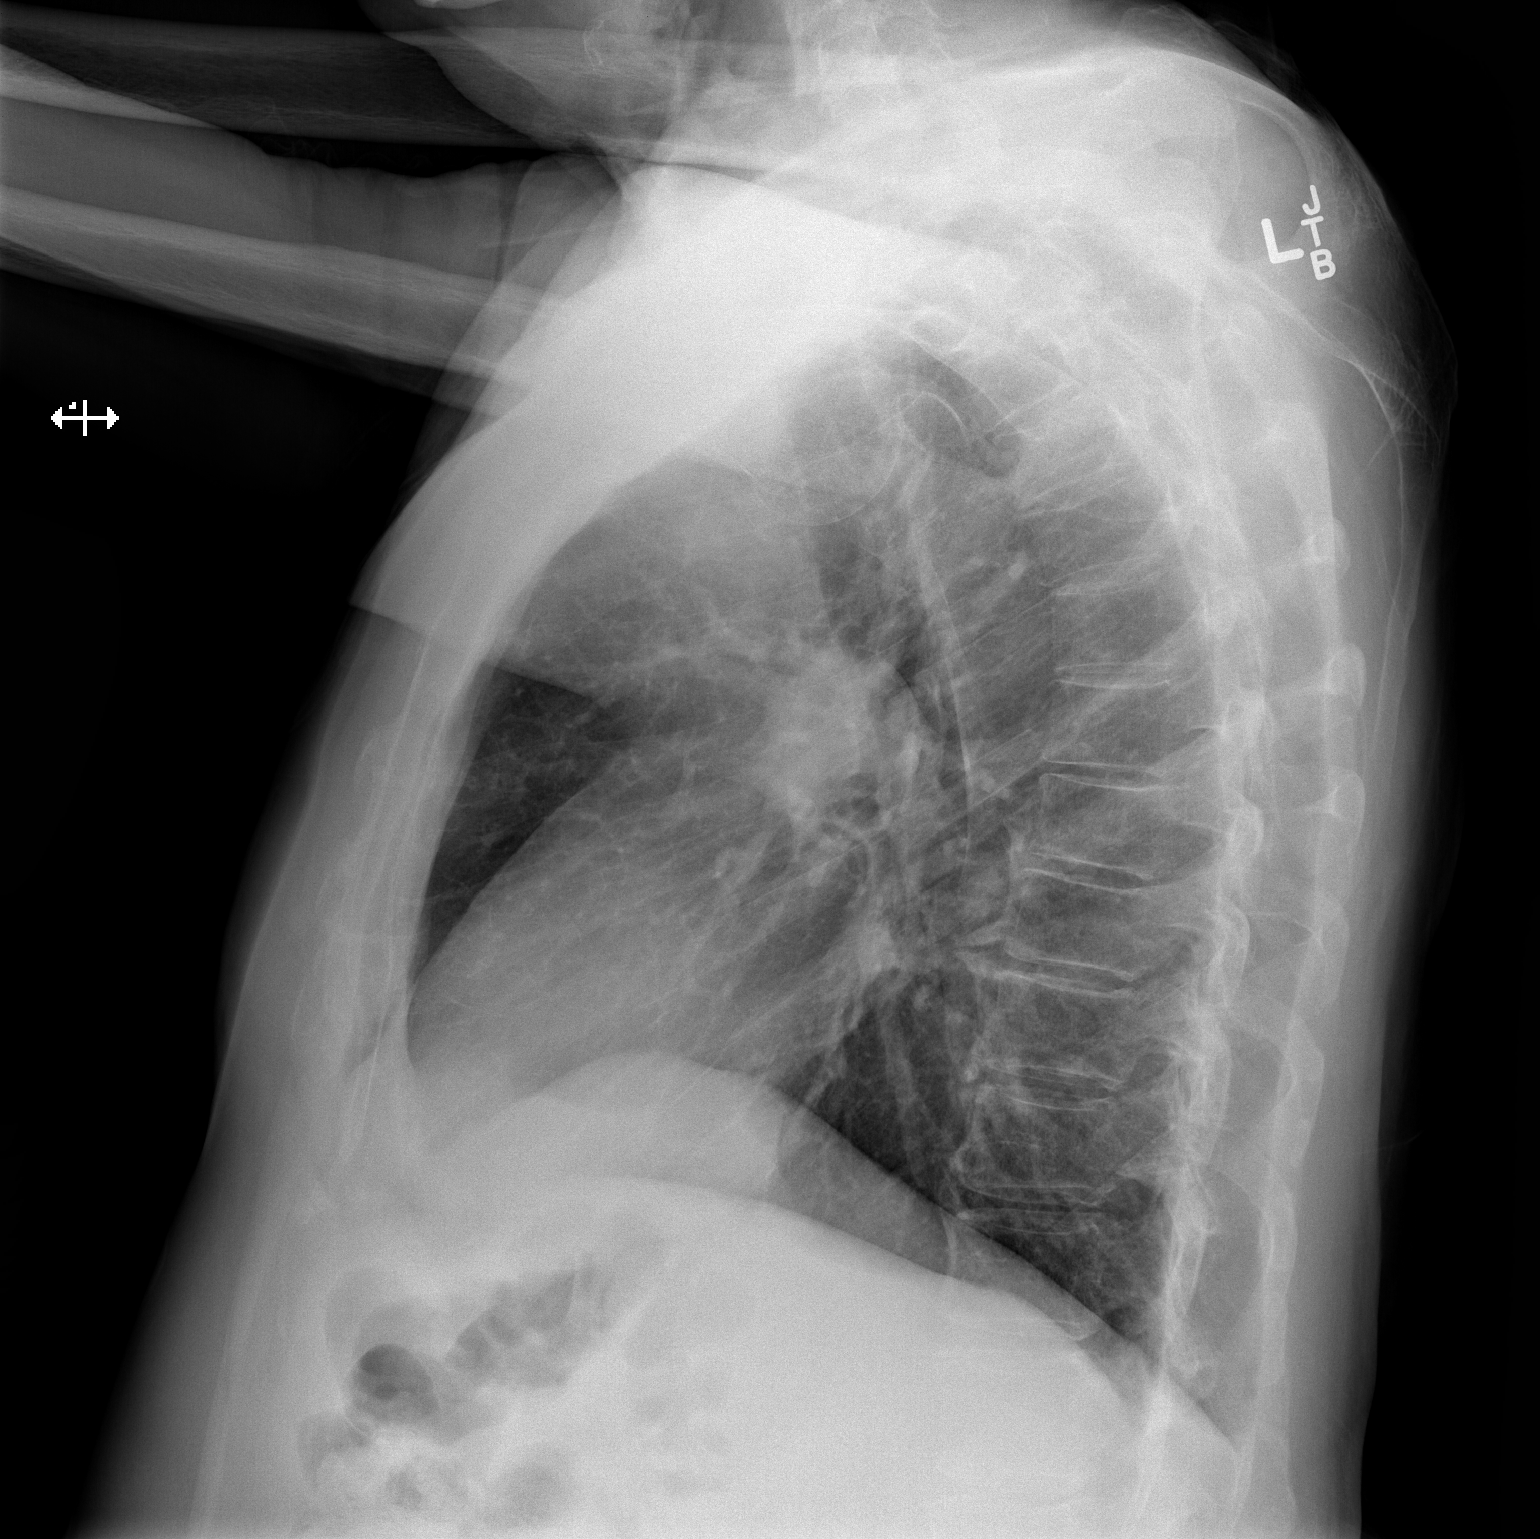

[2 of 2 positions shown; findings below may reference images not displayed]

FINDINGS: Normal heart size.
Upper normal size of central pulmonary arteries.
Tortuous thoracic aorta.
Peribronchial thickening and slight hyperaeration.
No acute infiltrate, pleural effusion or pneumothorax.
Questionable nodular density at right apex, could represent
clothing artifact but pulmonary nodule is not excluded.
Bones appear demineralized.
IMPRESSION: Minimal bronchitic changes and hyperaeration.
Questionable nodular density versus clothing artifact at right
apex; recommend repeat PA chest radiograph with shirt/clothing
removed for better assessment.

## 2013-07-25 IMAGING — CR DG CHEST 1V
1 series · 1 of 1 positions shown · non-contrast
Comparison: Chest radiograph performed 06/16/2013

CLINICAL DATA: Preoperative chest radiograph; repeat chest
radiograph recommended for nodular density at right lung apex,
thought to be outside the patient.

CHEST - 1 VIEW

[w chest pa]
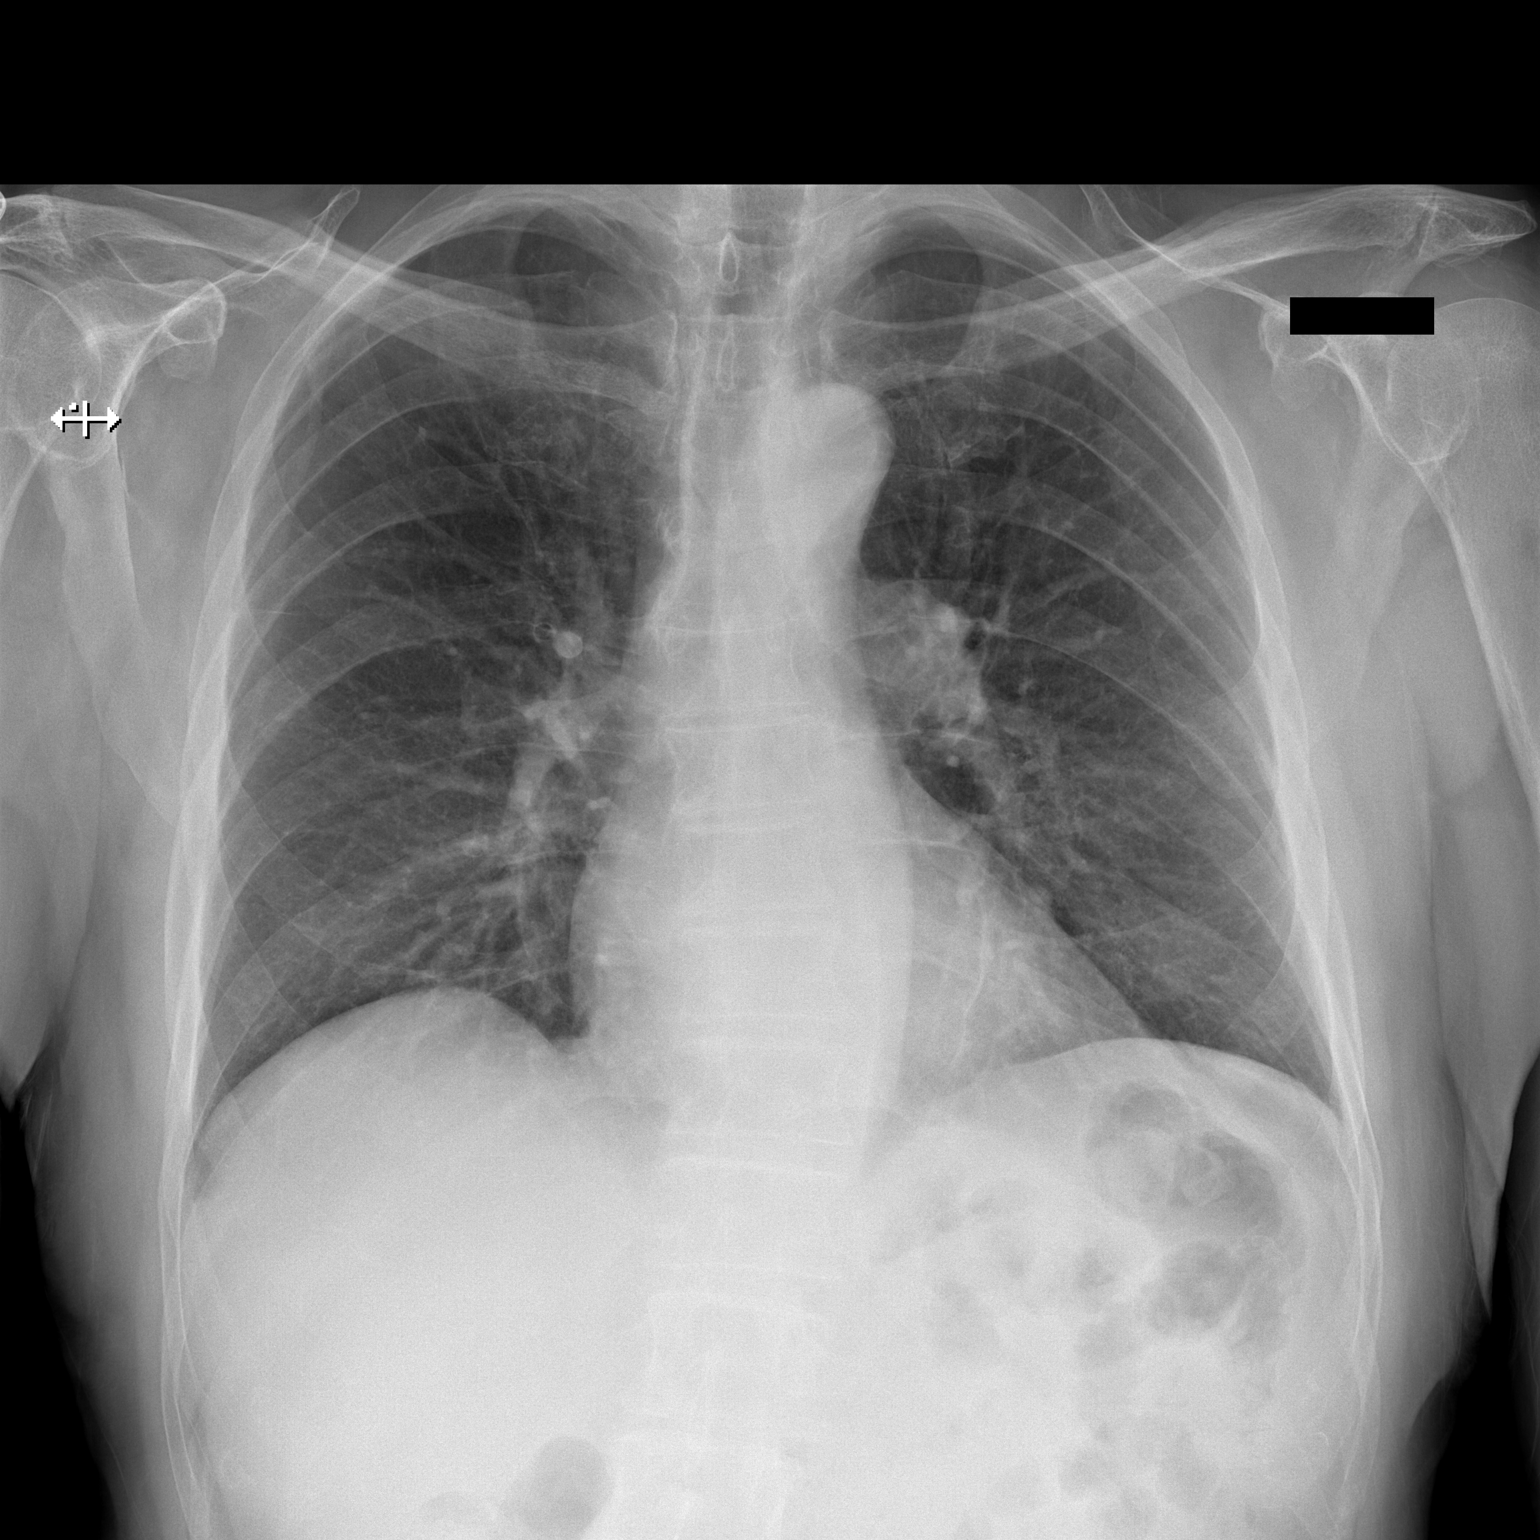

[1 of 1 positions shown; findings below may reference images not displayed]

FINDINGS: The previously noted density at the right lung apex is no
longer seen, and was likely outside the patient.

The lungs are well-aerated and clear.  There is no evidence of
focal opacification, pleural effusion or pneumothorax.

The cardiomediastinal silhouette is within normal limits.  No acute
osseous abnormalities are seen.
IMPRESSION: No acute cardiopulmonary process seen; previous noted density at
the right lung apex is no longer seen, and was likely outside the
patient.

## 2013-07-25 IMAGING — DX DG LUMBAR SPINE 1V
1 series · 1 of 1 positions shown · non-contrast
Comparison: Plain films lumbar spine 03/16/2013 and MRI lumbar
spine 05/14/2013.

CLINICAL DATA: L2-4 lumbar surgery.

LUMBAR SPINE - 1 VIEW

[lat]
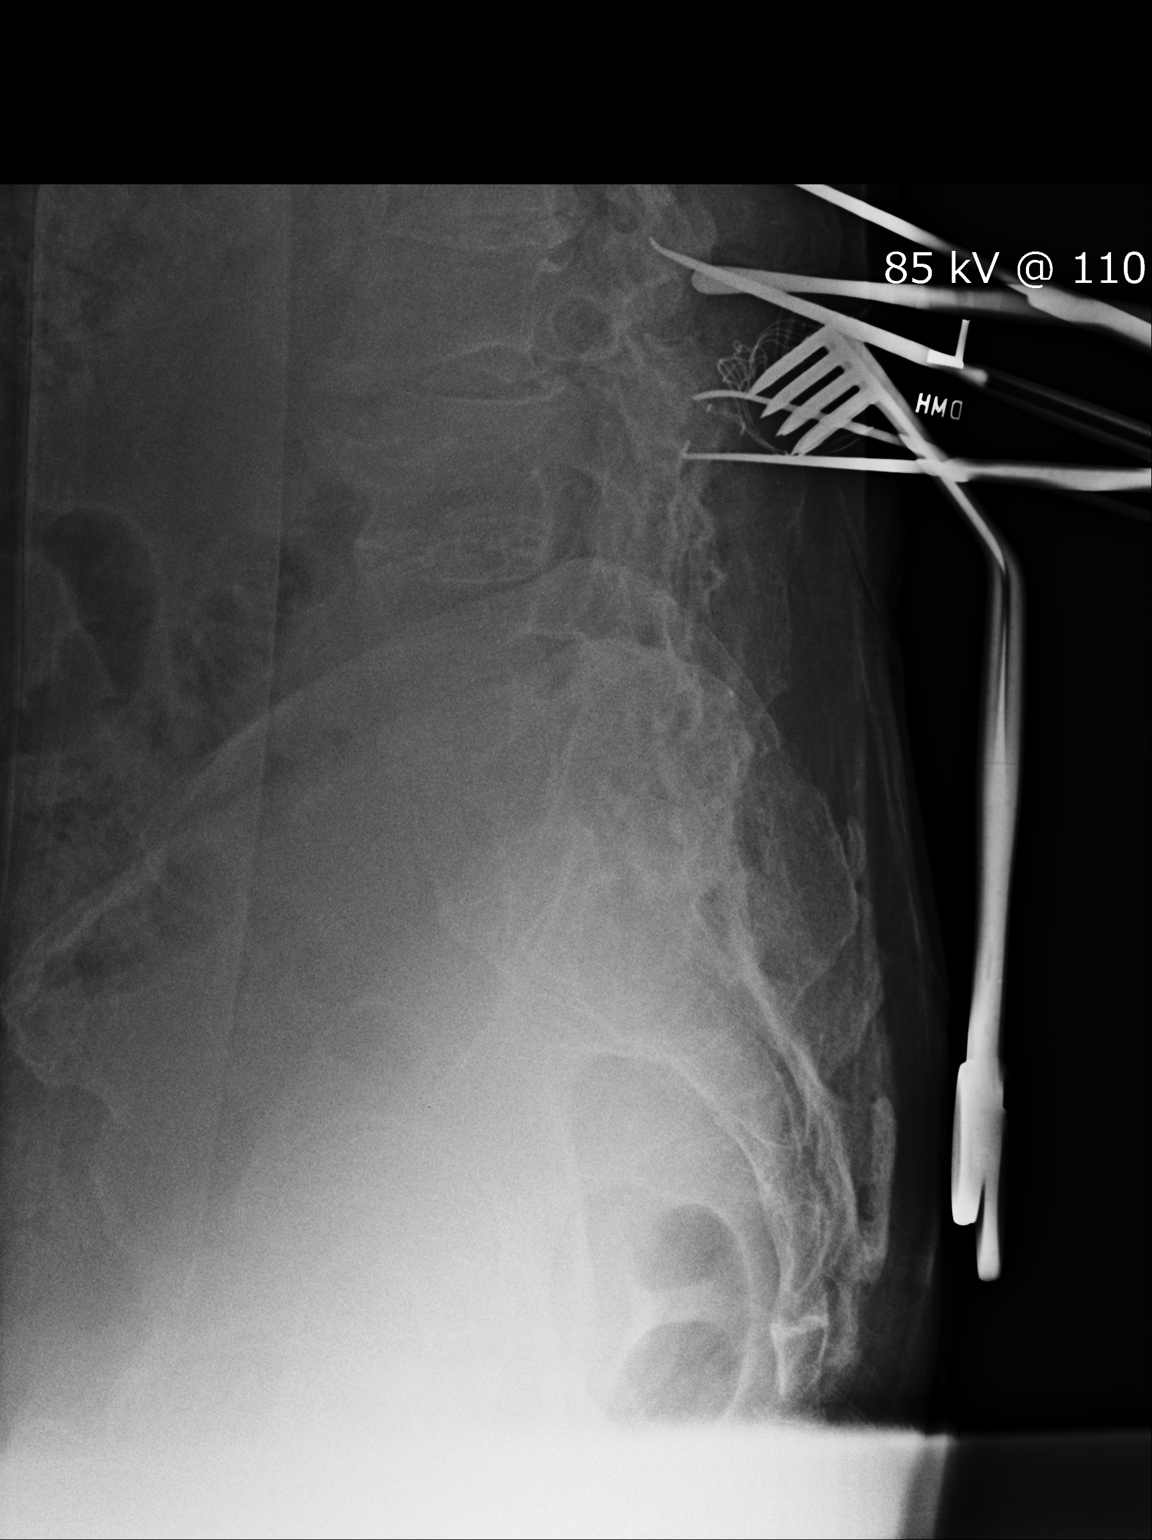

[1 of 1 positions shown; findings below may reference images not displayed]

FINDINGS: Single intraoperative view of the lumbar spine in the
lateral projection demonstrates probes at the level of the L2
pedicles directed toward the L1-2 interspace, L2-3 interspace, L3-4
interspace and L4 pedicles.  Transitional anatomy at the
lumbosacral junction is noted.
IMPRESSION: Localization as above.

## 2014-02-03 ENCOUNTER — Ambulatory Visit (INDEPENDENT_AMBULATORY_CARE_PROVIDER_SITE_OTHER): Payer: Medicare Other

## 2014-02-03 ENCOUNTER — Encounter (INDEPENDENT_AMBULATORY_CARE_PROVIDER_SITE_OTHER): Payer: Self-pay | Admitting: Neurology

## 2014-02-03 DIAGNOSIS — M6281 Muscle weakness (generalized): Secondary | ICD-10-CM

## 2014-02-03 DIAGNOSIS — Z0289 Encounter for other administrative examinations: Secondary | ICD-10-CM

## 2014-02-03 DIAGNOSIS — G544 Lumbosacral root disorders, not elsewhere classified: Secondary | ICD-10-CM

## 2014-02-03 DIAGNOSIS — M2550 Pain in unspecified joint: Secondary | ICD-10-CM

## 2014-02-03 NOTE — Procedures (Signed)
HISTORY:  Joel Mcneil is a 68 year old gentleman with a history of lumbosacral spine surgery on 5 occasions. The last surgery was in July of 2014. The patient has had some issues with leg swelling and knee discomfort and muscle pain in the legs since going back on a statin medication for his cholesterol recently. The patient has had persistently elevated muscle enzyme levels, currently running in the 300-600 range. The patient is being evaluated for this issue.  NERVE CONDUCTION STUDIES:  Nerve conduction studies were performed on the right upper extremity. The distal motor latency for the right median nerve was prolonged, with a normal motor amplitude. The distal motor latency and motor amplitudes for the right ulnar nerve were normal. The F wave latency for the right median nerve was prolonged, with a normal nerve conduction velocity. The F wave latency and nerve conduction velocities for the right ulnar nerve were normal. The sensory latency for the right median nerve was prolonged, normal for the right ulnar nerve.  Nerve conduction studies were performed on both lower extremities. The distal motor latencies for the peroneal and posterior tibial nerves were normal bilaterally, with low motor amplitudes for these nerves bilaterally. The nerve conduction velocities for the peroneal and posterior tibial nerves were normal bilaterally. The H reflex latencies were absent bilaterally, and the peroneal sensory latencies were normal bilaterally.   EMG STUDIES:  EMG study was performed on the right lower extremity:  The tibialis anterior muscle reveals 2 to 5K motor units with decreased recruitment. 2+ fibrillations and positive waves were seen. The peroneus tertius muscle reveals 2 to 6K motor units with decreased recruitment. No fibrillations or positive waves were seen. The medial gastrocnemius muscle reveals 1 to 3K motor units with markedly reduced recruitment. One plus fibrillations and  positive waves were seen. The vastus lateralis muscle reveals 2 to 4K motor units with full recruitment. No fibrillations or positive waves were seen. The iliopsoas muscle reveals 2 to 5K motor units with full recruitment. No fibrillations or positive waves were seen. The biceps femoris muscle (long head) reveals 2 to 5K motor units with decreased recruitment. No fibrillations or positive waves were seen. The lumbosacral paraspinal muscles were tested at 3 levels, and revealed  One plus fibrillations and possible waves at the upper and middle levels tested. Insertional activity was decreased at the lower level. There was good relaxation.  EMG study was performed on the left lower extremity:  The tibialis anterior muscle reveals 2 to 5K motor units with decreased recruitment. No fibrillations or positive waves were seen. The peroneus tertius muscle reveals 2 to 5K motor units with decreased recruitment. No fibrillations or positive waves were seen. The medial gastrocnemius muscle reveals 1 to 3K motor units with markedly decreased recruitment. One plus fibrillations an positive waves were seen. The vastus lateralis muscle reveals 2 to 4K motor units with full recruitment. No fibrillations or positive waves were seen. The iliopsoas muscle reveals 2 to 4K motor units with full recruitment. No fibrillations or positive waves were seen. The biceps femoris muscle (long head) reveals 2 to 4K motor units with full recruitment. No fibrillations or positive waves were seen. The lumbosacral paraspinal muscles were tested at 3 levels, and revealed no abnormalities of insertional activity at all 3 levels tested. Insertional activity was decreased at the lower level. There was good relaxation.   IMPRESSION:  Nerve conduction studies done on the right upper extremity and both lower extremities shows evidence of a mild  right carpal tunnel syndrome. There is evidence of diffuse lowering of motor amplitudes in the  lower extremities, with sensory sparing. EMG evaluations of both lower extremities show primarily chronic with some acute denervation involving the L5 and S1 nerve root distributions bilaterally, slightly more prominent on the right than the left. The lowering of motor amplitudes seen on nerve conduction velocities may be related to the chronic lumbosacral radiculopathies in part, and in part related to significant peripheral edema seen clinically. There is no evidence of an overlying myopathy or myositis.  Marlan Palau MD 02/03/2014 2:08 PM  [facility] [address] [place], [place] [address]  Phone [phone] Fax [phone]

## 2014-10-20 ENCOUNTER — Encounter: Payer: Self-pay | Admitting: Neurology

## 2014-10-26 ENCOUNTER — Encounter: Payer: Self-pay | Admitting: Neurology

## 2016-04-05 ENCOUNTER — Other Ambulatory Visit: Payer: Self-pay | Admitting: Pain Medicine

## 2016-04-05 DIAGNOSIS — M25511 Pain in right shoulder: Secondary | ICD-10-CM

## 2016-04-16 ENCOUNTER — Ambulatory Visit
Admission: RE | Admit: 2016-04-16 | Discharge: 2016-04-16 | Disposition: A | Discharge status: Home / Self Care | Payer: Medicare Other | Source: Ambulatory Visit | Attending: Pain Medicine | Admitting: Pain Medicine

## 2016-04-16 DIAGNOSIS — M25511 Pain in right shoulder: Secondary | ICD-10-CM

## 2016-05-10 ENCOUNTER — Other Ambulatory Visit: Payer: Medicare Other
